# Patient Record
Sex: Male | Born: 1940
Health system: Southern US, Community
[De-identification: ages and names within clinical notes are randomized; demographics above are authoritative.]

## PROBLEM LIST (undated history)

## (undated) DIAGNOSIS — I350 Nonrheumatic aortic (valve) stenosis: Secondary | ICD-10-CM

## (undated) DIAGNOSIS — R079 Chest pain, unspecified: Secondary | ICD-10-CM

## (undated) DIAGNOSIS — K219 Gastro-esophageal reflux disease without esophagitis: Secondary | ICD-10-CM

## (undated) DIAGNOSIS — E669 Obesity, unspecified: Secondary | ICD-10-CM

## (undated) DIAGNOSIS — I10 Essential (primary) hypertension: Secondary | ICD-10-CM

## (undated) DIAGNOSIS — R7309 Other abnormal glucose: Secondary | ICD-10-CM

## (undated) DIAGNOSIS — E785 Hyperlipidemia, unspecified: Secondary | ICD-10-CM

## (undated) DIAGNOSIS — R0609 Other forms of dyspnea: Secondary | ICD-10-CM

## (undated) DIAGNOSIS — M5416 Radiculopathy, lumbar region: Secondary | ICD-10-CM

## (undated) DIAGNOSIS — I517 Cardiomegaly: Secondary | ICD-10-CM

## (undated) HISTORY — DX: Other forms of dyspnea: R06.09

## (undated) HISTORY — DX: Hyperlipidemia, unspecified: E78.5

## (undated) HISTORY — DX: Nonrheumatic aortic (valve) stenosis: I35.0

## (undated) HISTORY — DX: Other abnormal glucose: R73.09

## (undated) HISTORY — DX: Obesity, unspecified: E66.9

## (undated) HISTORY — DX: Radiculopathy, lumbar region: M54.16

## (undated) HISTORY — DX: Essential (primary) hypertension: I10

## (undated) HISTORY — DX: Cardiomegaly: I51.7

## (undated) HISTORY — DX: Chest pain, unspecified: R07.9

## (undated) HISTORY — DX: Gastro-esophageal reflux disease without esophagitis: K21.9

---

## 2015-10-01 ENCOUNTER — Observation Stay (HOSPITAL_COMMUNITY)
Admission: EM | Admit: 2015-10-01 | Discharge: 2015-10-02 | Disposition: A | Discharge status: Home / Self Care | Payer: Medicare Other | Attending: Internal Medicine | Admitting: Internal Medicine

## 2015-10-01 ENCOUNTER — Encounter (HOSPITAL_COMMUNITY): Payer: Self-pay | Admitting: Emergency Medicine

## 2015-10-01 ENCOUNTER — Emergency Department (HOSPITAL_COMMUNITY): Payer: Medicare Other

## 2015-10-01 DIAGNOSIS — K219 Gastro-esophageal reflux disease without esophagitis: Secondary | ICD-10-CM | POA: Diagnosis not present

## 2015-10-01 DIAGNOSIS — R011 Cardiac murmur, unspecified: Secondary | ICD-10-CM | POA: Diagnosis not present

## 2015-10-01 DIAGNOSIS — I119 Hypertensive heart disease without heart failure: Principal | ICD-10-CM | POA: Insufficient documentation

## 2015-10-01 DIAGNOSIS — Z8249 Family history of ischemic heart disease and other diseases of the circulatory system: Secondary | ICD-10-CM | POA: Insufficient documentation

## 2015-10-01 DIAGNOSIS — R079 Chest pain, unspecified: Secondary | ICD-10-CM | POA: Diagnosis present

## 2015-10-01 DIAGNOSIS — E669 Obesity, unspecified: Secondary | ICD-10-CM

## 2015-10-01 DIAGNOSIS — Z803 Family history of malignant neoplasm of breast: Secondary | ICD-10-CM | POA: Diagnosis not present

## 2015-10-01 DIAGNOSIS — I2510 Atherosclerotic heart disease of native coronary artery without angina pectoris: Secondary | ICD-10-CM | POA: Diagnosis not present

## 2015-10-01 DIAGNOSIS — Z6831 Body mass index (BMI) 31.0-31.9, adult: Secondary | ICD-10-CM | POA: Diagnosis not present

## 2015-10-01 DIAGNOSIS — E785 Hyperlipidemia, unspecified: Secondary | ICD-10-CM

## 2015-10-01 DIAGNOSIS — F419 Anxiety disorder, unspecified: Secondary | ICD-10-CM | POA: Diagnosis not present

## 2015-10-01 DIAGNOSIS — R0600 Dyspnea, unspecified: Secondary | ICD-10-CM

## 2015-10-01 DIAGNOSIS — I10 Essential (primary) hypertension: Secondary | ICD-10-CM | POA: Diagnosis present

## 2015-10-01 DIAGNOSIS — Z7982 Long term (current) use of aspirin: Secondary | ICD-10-CM | POA: Diagnosis not present

## 2015-10-01 DIAGNOSIS — R0609 Other forms of dyspnea: Secondary | ICD-10-CM

## 2015-10-01 DIAGNOSIS — Z833 Family history of diabetes mellitus: Secondary | ICD-10-CM | POA: Insufficient documentation

## 2015-10-01 DIAGNOSIS — I517 Cardiomegaly: Secondary | ICD-10-CM | POA: Diagnosis present

## 2015-10-01 DIAGNOSIS — I350 Nonrheumatic aortic (valve) stenosis: Secondary | ICD-10-CM

## 2015-10-01 HISTORY — DX: Dyspnea, unspecified: R06.00

## 2015-10-01 HISTORY — DX: Other forms of dyspnea: R06.09

## 2015-10-01 HISTORY — DX: Chest pain, unspecified: R07.9

## 2015-10-01 HISTORY — DX: Gastro-esophageal reflux disease without esophagitis: K21.9

## 2015-10-01 HISTORY — DX: Cardiomegaly: I51.7

## 2015-10-01 HISTORY — DX: Obesity, unspecified: E66.9

## 2015-10-01 HISTORY — DX: Essential (primary) hypertension: I10

## 2015-10-01 LAB — BASIC METABOLIC PANEL
Anion gap: 9 (ref 5–15)
BUN: 13 mg/dL (ref 6–20)
CALCIUM: 9.7 mg/dL (ref 8.9–10.3)
CO2: 23 mmol/L (ref 22–32)
CREATININE: 0.89 mg/dL (ref 0.61–1.24)
Chloride: 105 mmol/L (ref 101–111)
GFR calc non Af Amer: >60 mL/min (ref >60)
GLUCOSE: 104 mg/dL — AB (ref 65–99)
Potassium: 3.9 mmol/L (ref 3.5–5.1)
Sodium: 137 mmol/L (ref 135–145)

## 2015-10-01 LAB — CBC WITH DIFFERENTIAL/PLATELET
BASOS PCT: 0 %
Basophils Absolute: 0 10*3/uL (ref 0.0–0.1)
EOS PCT: 0 %
Eosinophils Absolute: 0 10*3/uL (ref 0.0–0.7)
HEMATOCRIT: 47.8 % (ref 39.0–52.0)
Hemoglobin: 16 g/dL (ref 13.0–17.0)
Lymphocytes Relative: 18 %
Lymphs Abs: 1.3 10*3/uL (ref 0.7–4.0)
MCH: 29.6 pg (ref 26.0–34.0)
MCHC: 33.5 g/dL (ref 30.0–36.0)
MCV: 88.5 fL (ref 78.0–100.0)
MONO ABS: 0.5 10*3/uL (ref 0.1–1.0)
MONOS PCT: 7 %
NEUTROS ABS: 5.4 10*3/uL (ref 1.7–7.7)
Neutrophils Relative %: 75 %
PLATELETS: 236 10*3/uL (ref 150–400)
RBC: 5.4 MIL/uL (ref 4.22–5.81)
RDW: 12.6 % (ref 11.5–15.5)
WBC: 7.2 10*3/uL (ref 4.0–10.5)

## 2015-10-01 LAB — I-STAT TROPONIN, ED: Troponin i, poc: 0.01 ng/mL (ref 0.00–0.08)

## 2015-10-01 LAB — TROPONIN I: Troponin I: <0.03 ng/mL (ref <0.03)

## 2015-10-01 LAB — LIPID PANEL
Cholesterol: 206 mg/dL — ABNORMAL HIGH (ref 0–200)
HDL: 35 mg/dL — ABNORMAL LOW (ref >40)
LDL CALC: 149 mg/dL — AB (ref 0–99)
Total CHOL/HDL Ratio: 5.9 RATIO
Triglycerides: 110 mg/dL (ref <150)
VLDL: 22 mg/dL (ref 0–40)

## 2015-10-01 MED ORDER — OMEPRAZOLE MAGNESIUM 20 MG PO TBEC: 20.0000 mg | DELAYED_RELEASE_TABLET | Freq: Every day | ORAL | Status: DC | PRN

## 2015-10-01 MED ORDER — ACETAMINOPHEN 325 MG PO TABS: 650.0000 mg | ORAL_TABLET | Freq: Four times a day (QID) | ORAL | Status: DC | PRN

## 2015-10-01 MED ORDER — ASPIRIN 81 MG PO CHEW
324.0000 mg | CHEWABLE_TABLET | Freq: Once | ORAL | Status: AC
  Administered 2015-10-01: 324 mg via ORAL
  Filled 2015-10-01: qty 4

## 2015-10-01 MED ORDER — ACETAMINOPHEN 650 MG RE SUPP: 650.0000 mg | Freq: Four times a day (QID) | RECTAL | Status: DC | PRN

## 2015-10-01 MED ORDER — PNEUMOCOCCAL VAC POLYVALENT 25 MCG/0.5ML IJ INJ: 0.5000 mL | INJECTION | INTRAMUSCULAR | Status: DC

## 2015-10-01 MED ORDER — ENOXAPARIN SODIUM 40 MG/0.4ML ~~LOC~~ SOLN
40.0000 mg | SUBCUTANEOUS | Status: DC
  Administered 2015-10-01: 40 mg via SUBCUTANEOUS
  Filled 2015-10-01: qty 0.4

## 2015-10-01 MED ORDER — INFLUENZA VAC SPLIT QUAD 0.5 ML IM SUSY: 0.5000 mL | PREFILLED_SYRINGE | INTRAMUSCULAR | Status: DC

## 2015-10-01 MED ORDER — HYDROCHLOROTHIAZIDE 12.5 MG PO CAPS
12.5000 mg | ORAL_CAPSULE | Freq: Every day | ORAL | Status: DC
  Administered 2015-10-01: 12.5 mg via ORAL
  Filled 2015-10-01: qty 1

## 2015-10-01 MED ORDER — ASPIRIN EC 81 MG PO TBEC
81.0000 mg | DELAYED_RELEASE_TABLET | Freq: Every day | ORAL | Status: DC
  Administered 2015-10-02: 81 mg via ORAL
  Filled 2015-10-01: qty 1

## 2015-10-01 MED ORDER — SODIUM CHLORIDE 0.9% FLUSH: 3.0000 mL | Freq: Two times a day (BID) | INTRAVENOUS | Status: DC

## 2015-10-01 NOTE — H&P (Signed)
Date: 10/01/2015               Patient Name:  Gabriel Sosa MRN: [id number]  DOB: May 05, 1940 Age / Sex: 75 y.o., male   PCP: No primary care provider on file.         Medical Service: Internal Medicine Teaching Service         Attending Physician: Dr. Judyann Munson, MD    First Contact: Dr. Peggyann Juba Pager: [phone]  Second Contact: Dr. Lawerance Bach Pager: [phone]       After Hours (After 5p/  First Contact Pager: [phone]  weekends / holidays): Second Contact Pager: [phone]   Chief Complaint: Chest heaviness  History of Present Illness: Gabriel Sosa is a 75 yo male with PMHx of HTN and obesity who presented to the ED with high blood pressure and anxiety .   Patient specifically came to the ED today because of being scared after seeing his blood pressure 170/110. He was asymptomatic at this time, but after seeing this number he became "scared to death" and then became short of breath. Patient admits to intermittent shortness of breath over the last 3 months which occurs with exertion, but does not occur always with exertion. He does not have associated lightheadedness, nausea, vomiting, palpitations, numbness or palpitations. Separately, he has had a few episodes of chest heaviness that were not associated with shortness of breath or exertion.  He has been more sedentary for last 4 years.  Patient states he plays golf, but normally rides in a cart and also uses a riding lawn mower, but admits that it is strenuous to get it out. The last episode of shortness of breath occurred one week ago. Patient admits to having increased stress and anxiety recently as he has been rushing to finish a seminary class. He noticed increased blood pressure at home and had been taking his wife's lisinopril for the last 3 days.   Patient admits to occasional tingling and pruritus when he showers, mostly in his left hand. He admits to a 30 pounds weight loss after cutting out carbs since July. Patient has  a history of heartburn and will occasional take a Zantac with relief. This has improved after losing weight. He denies orthopnea, lower extremity swelling.    In the ED, he was chest pain free, hypertensive to up to 180s/90s, and saturating well on room air.  He received 324 mg aspirin.   Meds:  Current Meds  Medication Sig  . amoxicillin (AMOXIL) 500 MG capsule Take 500 mg by mouth 3 (three) times daily. 7 days course filled from 09/23/15  . lisinopril (PRINIVIL,ZESTRIL) 20 MG tablet Take 20 mg by mouth daily as needed (High blood pressure).     Allergies: Allergies as of 10/01/2015  . (No Known Allergies)   Family History:  Mother: CAD, T2DM, Breast Cancer, Dementia  Social History: Denies Tobacco Use, former smoker 3 pack year history 50 years ago. Denies alcohol or illicit drug use.  Surgical History: Sebaceous Cyst Removal  Medicines: Metamucil QD, Zantac QD prn  Review of Systems: A complete ROS was negative except as per HPI.  Physical Exam: Blood pressure 147/87, pulse 76, temperature 98.4 F (36.9 C), temperature source Oral, resp. rate 12, SpO2 97 %.  Physical Exam  Constitutional: He is oriented to person, place, and time. He appears well-developed and well-nourished. No distress.  HENT:  Head: Normocephalic and atraumatic.  Eyes: Conjunctivae are normal. No scleral icterus.  Neck: Normal range  of motion. Neck supple.  Cardiovascular:  Normal rate, regular rhythm, normal S1S2, no gallop, 3/6 holosystolic murmur loudest over RUSB  Pulmonary/Chest: Effort normal and breath sounds normal. No respiratory distress.  Abdominal: Soft. He exhibits no distension. There is no tenderness.  Musculoskeletal: He exhibits no edema or tenderness.  Neurological: He is alert and oriented to person, place, and time.  Skin: Skin is dry.  Psychiatric:  Mildly anxious mood and affect, talkative     CBC Latest Ref Rng & Units 10/01/2015  WBC 4.0 - 10.5 K/uL 7.2  Hemoglobin 13.0  - 17.0 g/dL 47.816.0  Hematocrit 29.539.0 - 52.0 % 47.8  Platelets 150 - 400 K/uL 236   BMP Latest Ref Rng & Units 10/01/2015  Glucose 65 - 99 mg/dL 621(H104(H)  BUN 6 - 20 mg/dL 13  Creatinine 0.860.61 - 5.781.24 mg/dL 4.690.89  Sodium 629135 - 528145 mmol/L 137  Potassium 3.5 - 5.1 mmol/L 3.9  Chloride 101 - 111 mmol/L 105  CO2 22 - 32 mmol/L 23  Calcium 8.9 - 10.3 mg/dL 9.7   Troponin (Point of Care Test)  Recent Labs  10/01/15 1010  TROPIPOC 0.01    EKG: NSR, no ST changes, TWI, or Q waves  CXR:  "IMPRESSION: 1.  Low lung volumes with mild bibasilar atelectasis.  2. Cardiomegaly.  No pulmonary venous congestion ."  Assessment & Plan by Problem: Principal Problem:   Chest pain Active Problems:   HTN (hypertension)   Cardiomegaly   Obesity  Gabriel Sosa is a 75 yo male with PMHx of HTN and obesity who presented to the ED with high blood pressure and anxiety regarding his heart health.  His dyspnea is exertional and relieved by rest, but is not associated with chest pain and is not reliably associated with exercise.  No cough or wheezing to suggest COPD.  Afebrile, with clear chest radiographs and no leukocytosis to suggest pneumonia.  No orthopnea, PND, or edema to suggest decompensated heart failure, but has cardiomegaly on CXR and untreated HTN so is at risk.  Low risk for PE (Wells 0).  Stable angina and anxiety are the most likely considerations.  His HEART Score is low to moderate (3-4) with known risk factors of age, obesity, and HTN.  #Dyspnea -Telemetry -Trend troponins -Repeat EKG in morning -Lipid panel -A1c -Evaluate indications for statin therapy pending risk factor characterization  #Systolic Murmur Holosystolic murmur suspicious for aortic stenosis.  No prior evaluation. -Echo  #HTN Hypertensive here, with reported numerous home elevated BPs over the past months.  Likely represents essential hypertension. -Start HCTZ 12.5, increase to 25 tomorrow  DVT/PE ppx: Lovenox SQ  QD FEN: HH CODE: FULL  Dispo: Admit patient to Observation with expected length of stay less than 2 midnights.  Signed: Alm BustardMatthew O'Sullivan, MD 10/01/2015, 12:04 PM  Pager: [phone]

## 2015-10-01 NOTE — ED Triage Notes (Signed)
Pt here with htn, CP, left arm pain and dizziness x several days

## 2015-10-01 NOTE — ED Provider Notes (Signed)
[facility]-EMERGENCY DEPT Provider Note   CSN: [id number] Arrival date & time: 10/01/15  4098     History   Chief Complaint Chief Complaint  Patient presents with  . Chest Pain    HPI Karmine Kauer Savarese is a 75 y.o. male.  The history is provided by the patient.  Chest Pain   Pertinent negatives include no abdominal pain, no cough, no fever, no nausea, no shortness of breath and no vomiting.  75 year old male who presents with intermittent chest pain. He has no known past medical history, but states he has not been to a primary care doctor doctor in several years. Has been told he has had elevated blood pressure by his dentist recently. States that over the course of the past several months he has had intermittent chest pain. Pain described as chest tightness and achiness in the center of his chest that is nonradiating. It is associated with tingling and achiness in the left arm and mild shortness of breath and occasional lightheadedness. He primarily notices these symptoms when he is doing activities such as walking across the street or across the parking lot. The pain lasts for few minutes and would go away with rest. He has history of tobacco use and his teenage years and early 90s. No strong family history of CAD. Encouraged by wife to come to ED for evaluation. No chest pain currently.  History reviewed. No pertinent past medical history.  Patient Active Problem List   Diagnosis Date Noted  . Chest pain 10/01/2015    History reviewed. No pertinent surgical history.     Home Medications    Prior to Admission medications   Medication Sig Start Date End Date Taking? Authorizing Provider  amoxicillin (AMOXIL) 500 MG capsule Take 500 mg by mouth 3 (three) times daily. 7 days course filled from 09/23/15 09/23/15  Yes Historical Provider, MD  lisinopril (PRINIVIL,ZESTRIL) 20 MG tablet Take 20 mg by mouth daily as needed (High blood pressure).    Yes Historical Provider, MD    omeprazole (PRILOSEC OTC) 20 MG tablet Take 20 mg by mouth daily as needed (Heart burn).   Yes Historical Provider, MD    Family History History reviewed. No pertinent family history.  Social History Social History  Substance Use Topics  . Smoking status: Never Smoker  . Smokeless tobacco: Never Used  . Alcohol use No     Allergies   Review of patient's allergies indicates no known allergies.   Review of Systems Review of Systems  Constitutional: Negative for fever.  Respiratory: Negative for cough and shortness of breath.   Cardiovascular: Negative for leg swelling.  Gastrointestinal: Negative for abdominal pain, nausea and vomiting.  All other systems reviewed and are negative.    Physical Exam Updated Vital Signs BP 175/89   Pulse 76   Temp 98.4 F (36.9 C) (Oral)   Resp 13   SpO2 97%   Physical Exam Physical Exam  Nursing note and vitals reviewed. Constitutional: Well developed, well nourished, non-toxic, and in no acute distress Head: Normocephalic and atraumatic.  Mouth/Throat: Oropharynx is clear and moist.  Neck: Normal range of motion. Neck supple.  Cardiovascular: Normal rate and regular rhythm.  No LE edema Pulmonary/Chest: Effort normal and breath sounds normal.  Abdominal: Soft. There is no tenderness. There is no rebound and no guarding.  Musculoskeletal: Normal range of motion.  Neurological: Alert, no facial droop, fluent speech, moves all extremities symmetrically Skin: Skin is warm and dry.  Psychiatric: Cooperative  ED Treatments / Results  Labs (all labs ordered are listed, but only abnormal results are displayed) Labs Reviewed  BASIC METABOLIC PANEL - Abnormal; Notable for the following:       Result Value   Glucose, Bld 104 (*)    All other components within normal limits  CBC WITH DIFFERENTIAL/PLATELET  I-STAT TROPOININ, ED    EKG  EKG Interpretation  Date/Time:  Friday October 01 2015 [id number] EDT Ventricular Rate:   88 PR Interval:  190 QRS Duration: 76 QT Interval:  366 QTC Calculation: 442 R Axis:   82 Text Interpretation:  Normal sinus rhythm Normal ECG no prior EKG  Confirmed by Efren Kross MD, Velma Agnes [phone]([id number]) on 10/01/2015 10:38:58 AM       Radiology Dg Chest 2 View  Result Date: 10/01/2015 CLINICAL DATA:  Hypertension. EXAM: CHEST  2 VIEW COMPARISON:  No recent prior . FINDINGS: Mediastinum and hilar structures normal. Cardiomegaly with normal pulmonary vascularity. Low lung volumes with mild bibasilar atelectasis and/or infiltrate. IMPRESSION: 1.  Low lung volumes with mild bibasilar atelectasis. 2. Cardiomegaly.  No pulmonary venous congestion . Electronically Signed   By: Maisie Fushomas  Register   On: 10/01/2015 10:55    Procedures Procedures (including critical care time)  Medications Ordered in ED Medications  aspirin chewable tablet 324 mg (324 mg Oral Given 10/01/15 1001)     Initial Impression / Assessment and Plan / ED Course  I have reviewed the triage vital signs and the nursing notes.  Pertinent labs & imaging results that were available during my care of the patient were reviewed by me and considered in my medical decision making (see chart for details).  Clinical Course    75 year old male who presents with intermittent chest pain with activity. Chest pain-free in the ED. Is hypertensive, but remainder of his vital signs are non-concerning. He is well-appearing in no acute distress. EKG without acute ischemic changes. Troponin 1 is negative. Unclear of all of his risk factors as he has no primary care doctor for several years. However, with Heart score of 4, given age, obesity, moderately suspicious history. Will admit for remainder of his cardiac rule out, potential stress testing. Admitted to internal medicine teaching service.  Final Clinical Impressions(s) / ED Diagnoses   Final diagnoses:  Nonspecific chest pain    New Prescriptions New Prescriptions   No medications on file      Lavera Guiseana Duo Durinda Buzzelli, MD 10/01/15 1134

## 2015-10-01 NOTE — Discharge Summary (Signed)
Name: Gabriel Sosa MRN: [id number] DOB: Nov 11, 1940 75 y.o. PCP: No primary care provider on file.  Date of Admission: 10/01/2015  9:25 AM Date of Discharge: 10/02/2015 Attending Physician: Judyann Munson  Discharge Diagnosis: 1. Hypertension 2. Hyperlipidemia   Discharge Medications:   Medication List    STOP taking these medications   amoxicillin 500 MG capsule Commonly known as:  AMOXIL   lisinopril 20 MG tablet Commonly known as:  PRINIVIL,ZESTRIL     TAKE these medications   aspirin 81 MG EC tablet Take 1 tablet (81 mg total) by mouth daily. Start taking on:  10/03/2015   atorvastatin 40 MG tablet Commonly known as:  LIPITOR Take 1 tablet (40 mg total) by mouth daily at 6 PM.   hydrochlorothiazide 25 MG tablet Commonly known as:  HYDRODIURIL Take 1 tablet (25 mg total) by mouth daily. Start taking on:  10/03/2015   omeprazole 20 MG tablet Commonly known as:  PRILOSEC OTC Take 20 mg by mouth daily as needed (Heart burn).       Disposition and follow-up:   Gabriel Sosa was discharged from [facility] in Good condition.  At the hospital follow up visit please address:  1.  HTN.  Started on HCTZ 25mg  daily for essential hypertension.  Please reassess blood pressure and manage antihypertensives.  2.  HL.  Discharged with high intensity statin for primary prevention of CAD.   3.  Labs / imaging needed at time of follow-up: BMP  4.  Pending labs/ test needing follow-up: echocardiogram (ordered, to be scheduled)  Follow-up Appointments: Follow-up Information    [facility]. Schedule an appointment as soon as possible for a visit in 2 week(s).   Why:  They will call you to schedule an appointment within the next 2 weeks.  If you do not hear from them before the end of the week, please call them to schedule. Contact information: [address] [place] [address] [phone]          Hospital Course by problem list: Principal Problem:   HTN (hypertension) Active Problems:   Cardiomegaly   Obesity   DOE (dyspnea on exertion)   1. Hypertension He has noted home BPs up to 170s/100s over the past several months, and anxiety over a high home BP reading at home motivated him to present to the ED.  Consistent with his reported home measurements, he was hypertensive with highest BPs 180s/90s, likely representing essential hypertension.  He was started on HCTZ 25mg  daily and instructed to follow up at the [facility] to establish a PCP  2.  Dyspnea Gabriel Sosa has had occasional, intermittent dyspnea on exertion for the past 3 months that has not been accompanied  by chest discomfort, diaphoresis, or nausea. Separately, he has had rare episodes of chest heaviness lasting less than 1 minute.  He is anxious about his cardiac health.   He has not had regular medical care for many years.  He has several risk factors for CAD, including age, sex, obesity, HTN, HL and sedentary lifestyle.  ACS was ruled out with normal EKGs without signs of ischemia and negative troponins.  His risk factors were further investigated with lipid panel which showed hyperlipidemia.  HgbA1c normal at 5.1%.  His ASCVD is 39.7%.  He was discharged on 81 mg aspirin and high intensity statin for primary prevention of cardiovascular disease.  3.  Systolic Murmur He was found to have a  3/6 holosystolic murmur in the aortic position. Outpatient echocardiogram ordered, echo lab should call him on Monday to schedule.  Discharge Vitals:   BP (!) 161/80 (BP Location: Right Arm) Comment: MD notified  Pulse 79   Temp 98.1 F (36.7 C) (Oral)   Resp 18   Ht 6\' 1"  (1.854 m)   Wt 238 lb (108 kg)   SpO2 100%   BMI 31.40 kg/m   Pertinent Labs, Studies, and Procedures:   EKG: normal sinus rhythm, no Q waves, TWI, ST changes   Cardiac Panel (last 3 results)  Recent Labs  10/01/15 1707 10/01/15 2220    TROPONINI <0.03 <0.03   Lipid Panel     Component Value Date/Time   CHOL 206 (H) 10/01/2015 1707   TRIG 110 10/01/2015 1707   HDL 35 (L) 10/01/2015 1707   CHOLHDL 5.9 10/01/2015 1707   VLDL 22 10/01/2015 1707   LDLCALC 149 (H) 10/01/2015 1707   Lab Results  Component Value Date   HGBA1C 5.1 10/01/2015    Discharge Instructions: Discharge Instructions    Diet - low sodium heart healthy    Complete by:  As directed    Increase activity slowly    Complete by:  As directed      You were admitted to the hospital out of concern for your heart.  We monitored you over night and made sure that you were not having a heart attack and that nothing else dangerous is going on at the moment.  We did find that you have high blood pressure and high cholesterol, both of which increase your chances of having a heart attack in the long run.  You also have a heart murmur.  This is not an emergency, but we would like to schedule you for an echocardiogram (ultrasound of the heart) to see what is causing it.  They should call you to schedule the echocardiogram this week.  I have given you prescriptions for HCTZ for blood pressure, atorvastatin for cholesterol, and aspirin to lower risk of heart attack.  Please take these medicines every day as prescribed.  It is important to follow regularly with a primary care doctor.  We will [facility].  The secretary should call you this week to schedule an appointment.  If you have chest pain or shortness of breath, please call [phone] or come to the ED as these may be signs of a heart attack.  Signed: Alm BustardMatthew O'Sullivan, MD 10/02/2015, 9:43 PM   Pager: [phone]

## 2015-10-01 NOTE — Progress Notes (Signed)
Pt questioned about DNR status.  He states if he coded he would not want to be on life support, but would like us to attempt basic resuscitation.  MD paged to notify clarification in code status needed.

## 2015-10-02 ENCOUNTER — Other Ambulatory Visit (HOSPITAL_COMMUNITY): Payer: Medicare Other

## 2015-10-02 DIAGNOSIS — R011 Cardiac murmur, unspecified: Secondary | ICD-10-CM

## 2015-10-02 DIAGNOSIS — I10 Essential (primary) hypertension: Secondary | ICD-10-CM | POA: Diagnosis not present

## 2015-10-02 DIAGNOSIS — R0609 Other forms of dyspnea: Secondary | ICD-10-CM

## 2015-10-02 DIAGNOSIS — K219 Gastro-esophageal reflux disease without esophagitis: Secondary | ICD-10-CM | POA: Diagnosis not present

## 2015-10-02 DIAGNOSIS — E785 Hyperlipidemia, unspecified: Secondary | ICD-10-CM | POA: Diagnosis not present

## 2015-10-02 DIAGNOSIS — F419 Anxiety disorder, unspecified: Secondary | ICD-10-CM | POA: Diagnosis not present

## 2015-10-02 DIAGNOSIS — I119 Hypertensive heart disease without heart failure: Secondary | ICD-10-CM | POA: Diagnosis not present

## 2015-10-02 LAB — BASIC METABOLIC PANEL
Anion gap: 6 (ref 5–15)
BUN: 12 mg/dL (ref 6–20)
CALCIUM: 9.4 mg/dL (ref 8.9–10.3)
CO2: 27 mmol/L (ref 22–32)
CREATININE: 0.89 mg/dL (ref 0.61–1.24)
Chloride: 103 mmol/L (ref 101–111)
GFR calc Af Amer: >60 mL/min (ref >60)
Glucose, Bld: 95 mg/dL (ref 65–99)
Potassium: 3.8 mmol/L (ref 3.5–5.1)
SODIUM: 136 mmol/L (ref 135–145)

## 2015-10-02 LAB — CBC
HCT: 46.5 % (ref 39.0–52.0)
Hemoglobin: 15.4 g/dL (ref 13.0–17.0)
MCH: 29.7 pg (ref 26.0–34.0)
MCHC: 33.1 g/dL (ref 30.0–36.0)
MCV: 89.6 fL (ref 78.0–100.0)
PLATELETS: 222 10*3/uL (ref 150–400)
RBC: 5.19 MIL/uL (ref 4.22–5.81)
RDW: 12.6 % (ref 11.5–15.5)
WBC: 8.1 10*3/uL (ref 4.0–10.5)

## 2015-10-02 LAB — TROPONIN I

## 2015-10-02 LAB — HEMOGLOBIN A1C
Hgb A1c MFr Bld: 5.1 % (ref 4.8–5.6)
Mean Plasma Glucose: 100 mg/dL

## 2015-10-02 MED ORDER — ATORVASTATIN CALCIUM 40 MG PO TABS: 40.0000 mg | ORAL_TABLET | Freq: Every day | ORAL | Status: DC

## 2015-10-02 MED ORDER — HYDROCHLOROTHIAZIDE 25 MG PO TABS: 25.0000 mg | ORAL_TABLET | Freq: Every day | ORAL | 3 refills | Status: DC

## 2015-10-02 MED ORDER — HYDROCHLOROTHIAZIDE 25 MG PO TABS
25.0000 mg | ORAL_TABLET | Freq: Every day | ORAL | Status: DC
  Administered 2015-10-02: 25 mg via ORAL
  Filled 2015-10-02: qty 1

## 2015-10-02 MED ORDER — ASPIRIN 81 MG PO TBEC: 81.0000 mg | DELAYED_RELEASE_TABLET | Freq: Every day | ORAL | Status: DC

## 2015-10-02 MED ORDER — ATORVASTATIN CALCIUM 40 MG PO TABS: 40.0000 mg | ORAL_TABLET | Freq: Every day | ORAL | 2 refills | Status: DC

## 2015-10-02 NOTE — Progress Notes (Signed)
   Subjective: Feels well, with no chest pain or dyspnea.  Discussed importance of establishing a primary care provider for health maintenance.  He agreed with the plan to start pharmacologic therapy for hypertension and hyperlipidemia.   Objective:  Vital signs in last 24 hours: Vitals:   10/01/15 1928 10/01/15 2020 10/01/15 2314 10/02/15 0332  BP: (!) 173/102 (!) 164/95 (!) 151/88 (!) 161/80  Pulse: 76 69 75 79  Resp: 18  18 18   Temp: 98.4 F (36.9 C)  98.2 F (36.8 C) 98.1 F (36.7 C)  TempSrc: Oral  Oral Oral  SpO2: 99% 100% 98% 100%  Weight:      Height:       Physical Exam  Constitutional: He is oriented to person, place, and time. He appears well-developed and well-nourished. No distress.  Cardiovascular:  Regular rate and rhythm, 3/6 holosystolic murmur loudest at RUSB  Pulmonary/Chest: Effort normal and breath sounds normal.  Abdominal: Soft.  Neurological: He is alert and oriented to person, place, and time.  Skin: Skin is warm and dry.  Psychiatric: He has a normal mood and affect. His behavior is normal.   Cardiac Panel (last 3 results)  Recent Labs  10/01/15 1707 10/01/15 2220  TROPONINI <0.03 <0.03   Lipid Panel     Component Value Date/Time   CHOL 206 (H) 10/01/2015 1707   TRIG 110 10/01/2015 1707   HDL 35 (L) 10/01/2015 1707   CHOLHDL 5.9 10/01/2015 1707   VLDL 22 10/01/2015 1707   LDLCALC 149 (H) 10/01/2015 1707   HgbA1c 5.1%  Assessment/Plan:  Principal Problem:   HTN (hypertension) Active Problems:   Cardiomegaly   Obesity   DOE (dyspnea on exertion)  #HTN Hypertensive here, with reported numerous home elevated BPs over the past months.  Likely represents true essential hypertension.  Started on HCTZ 12.5 mg, still hypertensive.  May well require multiple agents. -Increase HCTZ to 25 mg daily -[facility] follow-up  #CAD Risk Troponins negative, no ischemic changes on EKG, no ACS.  He is at elevated risk for ACS (39.7% 10 year risk), with  risk factors of age, sex, obesity, sedentary lifestyle, HTN, and HL. -High intensity statin  [facility]  #Systolic Murmur Holosystolic murmur suspicious for aortic stenosis.  No prior evaluation. -Echo before discharge  #Dyspnea No recent episodes, likely related to anxiety.  Dispo: Anticipated discharge today.  Alm BustardMatthew O'Sullivan, MD 10/02/2015, 11:06 AM Pager: [phone]

## 2015-10-02 NOTE — Discharge Instructions (Signed)
You were admitted to the hospital out of concern for your heart.  We monitored you over night and made sure that you were not having a heart attack and that nothing else dangerous is going on at the moment.  We did find that you have high blood pressure and high cholesterol, both of which increase your chances of having a heart attack in the long run.  You also have a heart murmur.  This is not an emergency, but we would like to schedule you for an echocardiogram (ultrasound of the heart) to see what is causing it.  They should call you to schedule the echocardiogram this week.  I have given you prescriptions for HCTZ for blood pressure, atorvastatin for cholesterol, and aspirin to lower risk of heart attack.  Please take these medicines every day as prescribed.  It is important to follow regularly with a primary care doctor.  We will plan to see you at the [facility] here at [facility]  The secretary should call you this week to schedule an appointment.  If you have chest pain or shortness of breath, please call 911 or come to the ED as these may be signs of a heart attack.   Chest Pain Observation It is often hard to give a specific diagnosis for the cause of chest pain. Among other possibilities your symptoms might be caused by inadequate oxygen delivery to your heart (angina). Angina that is not treated or evaluated can lead to a heart attack (myocardial infarction) or death. Blood tests, electrocardiograms, and X-rays may have been done to help determine a possible cause of your chest pain. After evaluation and observation, your health care provider has determined that it is unlikely your pain was caused by an unstable condition that requires hospitalization. However, a full evaluation of your pain may need to be completed, with additional diagnostic testing as directed. It is very important to keep your follow-up appointments. Not keeping your follow-up appointments could result in  permanent heart damage, disability, or death. If there is any problem keeping your follow-up appointments, you must call your health care provider. HOME CARE INSTRUCTIONS  Due to the slight chance that your pain could be angina, it is important to follow your health care provider's treatment plan and also maintain a healthy lifestyle:  Maintain or work toward achieving a healthy weight.  Stay physically active and exercise regularly.  Decrease your salt intake.  Eat a balanced, healthy diet. Talk to a dietitian to learn about heart-healthy foods.  Increase your fiber intake by including whole grains, vegetables, fruits, and nuts in your diet.  Avoid situations that cause stress, anger, or depression.  Take medicines as advised by your health care provider. Report any side effects to your health care provider. Do not stop medicines or adjust the dosages on your own.  Quit smoking. Do not use nicotine patches or gum until you check with your health care provider.  Keep your blood pressure, blood sugar, and cholesterol levels within normal limits.  Limit alcohol intake to no more than 1 drink per day for women who are not pregnant and 2 drinks per day for men.  Do not abuse drugs. SEEK IMMEDIATE MEDICAL CARE IF: You have severe chest pain or pressure which may include symptoms such as:  You feel pain or pressure in your arms, neck, jaw, or back.  You have severe back or abdominal pain, feel sick to your stomach (nauseous), or throw up (vomit).  You  are sweating profusely.  You are having a fast or irregular heartbeat.  You feel short of breath while at rest.  You notice increasing shortness of breath during rest, sleep, or with activity.  You have chest pain that does not get better after rest or after taking your usual medicine.  You wake from sleep with chest pain.  You are unable to sleep because you cannot breathe.  You develop a frequent cough or you are coughing up  blood.  You feel dizzy, faint, or experience extreme fatigue.  You develop severe weakness, dizziness, fainting, or chills. Any of these symptoms may represent a serious problem that is an emergency. Do not wait to see if the symptoms will go away. Call your local emergency services (911 in the [place] Do not drive yourself to the hospital. MAKE SURE YOU:  Understand these instructions.  Will watch your condition.  Will get help right away if you are not doing well or get worse.   This information is not intended to replace advice given to you by your health care provider. Make sure you discuss any questions you have with your health care provider.   Document Released: 01/28/2010 Document Revised: 12/31/2012 Document Reviewed: 06/27/2012 Elsevier Interactive Patient Education 2016 [employer].   Aspirin and Your Heart  Aspirin is a medicine that affects the way blood clots. Aspirin can be used to help reduce the risk of blood clots, heart attacks, and other heart-related problems.  SHOULD I TAKE ASPIRIN? Your health care provider will help you determine whether it is safe and beneficial for you to take aspirin daily. Taking aspirin daily may be beneficial if you:  Have had a heart attack or chest pain.  Have undergone open heart surgery such as coronary artery bypass surgery (CABG).  Have had coronary angioplasty.  Have experienced a stroke or transient ischemic attack (TIA).  Have peripheral vascular disease (PVD).  Have chronic heart rhythm problems such as atrial fibrillation. ARE THERE ANY RISKS OF TAKING ASPIRIN DAILY? Daily use of aspirin can increase your risk of side effects. Some of these include:  Bleeding. Bleeding problems can be minor or serious. An example of a minor problem is a cut that does not stop bleeding. An example of a more serious problem is stomach bleeding or bleeding into the brain. Your risk of bleeding is increased if you are also taking  non-steroidal anti-inflammatory medicine (NSAIDs).  Increased bruising.  Upset stomach.  An allergic reaction. People who have nasal polyps have an increased risk of developing an aspirin allergy. WHAT ARE SOME GUIDELINES I SHOULD FOLLOW WHEN TAKING ASPIRIN?   Take aspirin only as directed by your health care provider. Make sure you understand how much you should take and what form you should take. The two forms of aspirin are:  Non-enteric-coated. This type of aspirin does not have a coating and is absorbed quickly. Non-enteric-coated aspirin is usually recommended for people with chest pain. This type of aspirin also comes in a chewable form.  Enteric-coated. This type of aspirin has a special coating that releases the medicine very slowly. Enteric-coated aspirin causes less stomach upset than non-enteric-coated aspirin. This type of aspirin should not be chewed or crushed.  Drink alcohol in moderation. Drinking alcohol increases your risk of bleeding. WHEN SHOULD I SEEK MEDICAL CARE?   You have unusual bleeding or bruising.  You have stomach pain.  You have an allergic reaction. Symptoms of an allergic reaction include:  Hives.  Itchy skin.  Swelling of the lips, tongue, or face.  You have ringing in your ears. WHEN SHOULD I SEEK IMMEDIATE MEDICAL CARE?   Your bowel movements are bloody, dark red, or black in color.  You vomit or cough up blood.  You have blood in your urine.  You cough, wheeze, or feel short of breath. If you have any of the following symptoms, this is an emergency. Do not wait to see if the pain will go away. Get medical help at once. Call your local emergency services (911 in the [place] Do not drive yourself to the hospital.  You have severe chest pain, especially if the pain is crushing or pressure-like and spreads to the arms, back, neck, or jaw.  You have stroke-like symptoms, such as:   Loss of vision.   Difficulty talking.   Numbness  or weakness on one side of your body.   Numbness or weakness in your arm or leg.   Not thinking clearly or feeling confused.    This information is not intended to replace advice given to you by your health care provider. Make sure you discuss any questions you have with your health care provider.   Document Released: 12/09/2007 Document Revised: 01/16/2014 Document Reviewed: 04/02/2013 Elsevier Interactive Patient Education [employer]2016 Elsevier Inc.

## 2015-10-04 DIAGNOSIS — E785 Hyperlipidemia, unspecified: Secondary | ICD-10-CM

## 2015-10-04 DIAGNOSIS — I350 Nonrheumatic aortic (valve) stenosis: Secondary | ICD-10-CM

## 2015-10-04 DIAGNOSIS — K219 Gastro-esophageal reflux disease without esophagitis: Secondary | ICD-10-CM

## 2015-10-11 ENCOUNTER — Ambulatory Visit (HOSPITAL_COMMUNITY)
Admission: RE | Admit: 2015-10-11 | Discharge: 2015-10-11 | Disposition: A | Discharge status: Home / Self Care | Payer: Medicare Other | Source: Ambulatory Visit | Attending: Internal Medicine | Admitting: Internal Medicine

## 2015-10-11 DIAGNOSIS — I071 Rheumatic tricuspid insufficiency: Secondary | ICD-10-CM | POA: Diagnosis not present

## 2015-10-11 DIAGNOSIS — I352 Nonrheumatic aortic (valve) stenosis with insufficiency: Secondary | ICD-10-CM | POA: Insufficient documentation

## 2015-10-11 DIAGNOSIS — R011 Cardiac murmur, unspecified: Secondary | ICD-10-CM | POA: Diagnosis not present

## 2015-10-11 DIAGNOSIS — I517 Cardiomegaly: Secondary | ICD-10-CM | POA: Insufficient documentation

## 2015-10-11 NOTE — Progress Notes (Signed)
  Echocardiogram 2D Echocardiogram has been performed.  Gabriel Sosa 10/11/2015, 10:34 AM

## 2015-10-14 ENCOUNTER — Telehealth: Payer: Self-pay

## 2015-10-14 NOTE — Telephone Encounter (Signed)
APT. REMINDER CALL, LMTCB °

## 2015-10-15 ENCOUNTER — Ambulatory Visit (INDEPENDENT_AMBULATORY_CARE_PROVIDER_SITE_OTHER): Payer: Medicare Other | Admitting: Internal Medicine

## 2015-10-15 VITALS — BP 149/105 | HR 94 | Temp 98.0°F | Ht 73.0 in | Wt 235.9 lb

## 2015-10-15 DIAGNOSIS — I350 Nonrheumatic aortic (valve) stenosis: Secondary | ICD-10-CM

## 2015-10-15 DIAGNOSIS — Z23 Encounter for immunization: Secondary | ICD-10-CM | POA: Diagnosis not present

## 2015-10-15 DIAGNOSIS — Z09 Encounter for follow-up examination after completed treatment for conditions other than malignant neoplasm: Secondary | ICD-10-CM

## 2015-10-15 DIAGNOSIS — Z79899 Other long term (current) drug therapy: Secondary | ICD-10-CM

## 2015-10-15 DIAGNOSIS — R0609 Other forms of dyspnea: Secondary | ICD-10-CM | POA: Diagnosis not present

## 2015-10-15 DIAGNOSIS — I082 Rheumatic disorders of both aortic and tricuspid valves: Secondary | ICD-10-CM | POA: Diagnosis not present

## 2015-10-15 DIAGNOSIS — I10 Essential (primary) hypertension: Secondary | ICD-10-CM

## 2015-10-15 DIAGNOSIS — Z7982 Long term (current) use of aspirin: Secondary | ICD-10-CM

## 2015-10-15 DIAGNOSIS — Z0000 Encounter for general adult medical examination without abnormal findings: Secondary | ICD-10-CM | POA: Insufficient documentation

## 2015-10-15 DIAGNOSIS — Z87891 Personal history of nicotine dependence: Secondary | ICD-10-CM

## 2015-10-15 MED ORDER — HYDROCHLOROTHIAZIDE 25 MG PO TABS: 25.0000 mg | ORAL_TABLET | Freq: Every day | ORAL | 1 refills | Status: DC

## 2015-10-15 MED ORDER — LISINOPRIL 5 MG PO TABS: 5.0000 mg | ORAL_TABLET | Freq: Every day | ORAL | 1 refills | Status: DC

## 2015-10-15 NOTE — Patient Instructions (Signed)
[name] it was nice meeting you today.  -Continue taking Hydrochlorothiazide 25 mg daily   -Start taking Lisinopril 5 mg daily   -Return for a follow-up visit in 4 weeks

## 2015-10-16 NOTE — Assessment & Plan Note (Signed)
Influenza vaccine at this visit 

## 2015-10-16 NOTE — Assessment & Plan Note (Addendum)
A Noted to have a murmur on exam during recent hospitalization. Recent echo (10/11/15) showing possibly bicuspid, severely thickened and calcified aortic valve with mild-mod. aortic stenosis and mild-mod. aortic regurgitation. Showing trivial tricuspid regurgitation. LVEF 65-70% and grade 1 diastolic dysfunction.  Patient denies any episodes of syncope.  P -CTM -No surgical indication at this time as patient is asymptomatic.

## 2015-10-16 NOTE — Progress Notes (Signed)
   CC: Pt is here for a hospital follow-up of HTN, dyspnea, and heart murmur.   HPI:  [name] is a 75 y.o. M with a PMHx of conditions listed below presenting to the clinic for a hospital follow-up of HTN, dyspnea, and heart murmur. Please see problem based charting for the status of the patient's current and chronic medical conditions.   Past Medical History:  Diagnosis Date  . GERD (gastroesophageal reflux disease)     Review of Systems:  Pertinent positives mentioned in HPI. Remainder of all ROS negative.   Physical Exam:  Vitals:   10/15/15 1318  BP: (!) 149/105  Pulse: 94  Temp: 98 F (36.7 C)  TempSrc: Oral  SpO2: 99%  Weight: 235 lb 14.4 oz (107 kg)  Height: 6\' 1"  (1.854 m)   Physical Exam  Constitutional: He is oriented to person, place, and time. He appears well-developed and well-nourished. No distress.  HENT:  Head: Normocephalic and atraumatic.  Mouth/Throat: Oropharynx is clear and moist.  Eyes: EOM are normal.  Neck: Neck supple. No tracheal deviation present.  Cardiovascular: Normal rate, regular rhythm and intact distal pulses.   Grade 3/6 systolic ejection murmur best appreciated at the R upper sternal border.   Pulmonary/Chest: Effort normal and breath sounds normal. No respiratory distress.  Abdominal: Soft. Bowel sounds are normal. He exhibits no distension. There is no tenderness.  Musculoskeletal: Normal range of motion. He exhibits no edema.  Neurological: He is alert and oriented to person, place, and time.  Skin: Skin is warm and dry.    Assessment & Plan:   See Encounters Tab for problem based charting.  Patient discussed with Dr. Heide SparkNarendra

## 2015-10-16 NOTE — Assessment & Plan Note (Signed)
BP Readings from Last 3 Encounters:  10/15/15 (!) 149/105  10/02/15 (!) 161/80    Lab Results  Component Value Date   NA 136 10/02/2015   K 3.8 10/02/2015   CREATININE 0.89 10/02/2015    Assessment: Blood pressure control:  above goal (<150/90) Comments: Patient was diagnosed with HTN during recent hospitalization and started HCTZ 25 mg daily.   Plan: Medications:  Start Lisinopril 5 mg daily. Continue HCTZ as above.  Educational resources provided:   Educated patient about healthy eating (DASH diet) and exercise.  Other plans:  -RTC in 4 weeks  -Check BMET at next visit

## 2015-10-16 NOTE — Assessment & Plan Note (Signed)
A During his recent hospitalization, patient complained of a 3 month history of intermittent DOE. Also reported rare episodes of chest heaviness lasting less than 1 minute. ACS was ruled out (EKG w/o signs of ischemia and negative troponins). Patient was started on a statin (ASCVD 39.7%) and aspirin 81 mg daily. He has risk factors for CAD including age, gender, obesity, HTN, HLD, and sedentary lifestyle.  At present, patient denies having any DOE or chest discomfort.  P -Continue current mgmt

## 2015-10-18 NOTE — Progress Notes (Signed)
Internal Medicine Clinic Attending  Case discussed with Dr. Rathoreat the time of the visit. We reviewed the resident's history and exam and pertinent patient test results. I agree with the assessment, diagnosis, and plan of care documented in the resident's note.  

## 2015-11-05 ENCOUNTER — Telehealth: Payer: Self-pay

## 2015-11-05 NOTE — Telephone Encounter (Signed)
APT. REMINDER CALL, LMTCB °

## 2015-11-08 ENCOUNTER — Ambulatory Visit (INDEPENDENT_AMBULATORY_CARE_PROVIDER_SITE_OTHER): Payer: Medicare Other | Admitting: Internal Medicine

## 2015-11-08 VITALS — BP 157/70 | HR 73 | Temp 97.4°F | Ht 73.0 in | Wt 231.2 lb

## 2015-11-08 DIAGNOSIS — Z7982 Long term (current) use of aspirin: Secondary | ICD-10-CM | POA: Diagnosis not present

## 2015-11-08 DIAGNOSIS — I10 Essential (primary) hypertension: Secondary | ICD-10-CM | POA: Diagnosis not present

## 2015-11-08 DIAGNOSIS — E785 Hyperlipidemia, unspecified: Secondary | ICD-10-CM | POA: Diagnosis not present

## 2015-11-08 DIAGNOSIS — Z79899 Other long term (current) drug therapy: Secondary | ICD-10-CM

## 2015-11-08 DIAGNOSIS — Z87891 Personal history of nicotine dependence: Secondary | ICD-10-CM

## 2015-11-08 MED ORDER — LISINOPRIL 5 MG PO TABS: 10.0000 mg | ORAL_TABLET | Freq: Every day | ORAL | 1 refills | Status: DC

## 2015-11-08 NOTE — Progress Notes (Signed)
I saw and evaluated the patient.  I personally confirmed the key portions of Dr. O'Sullivan's history and exam and reviewed pertinent patient test results.  The assessment, diagnosis, and plan were formulated together and I agree with the documentation in the resident's note. 

## 2015-11-08 NOTE — Progress Notes (Signed)
   CC: high blood pressure  HPI:  [name] is a 75 y.o. man with history of HTN, AS, and GERD who presents for management of hypertension.  Please see A&P for status of the patient's chronic medical conditions.  He was admitted 9/22-9/23 for hypertension and anxiety regarding his high blood pressures.  ACS was ruled out with serial troponins and a non-ischemic EKG.  He was discharged on HCTZ 25 mg for hypertension, and atorvastatin 40 mg and aspirin 81 mg for primary prevention of CAD.  He was subsequently seen in clinic of 10/6, and started on Lisinopril 5 mg daily in addition to continuing HCTZ.  Denies SOB.  Moderate walking, ~30 min 3-4x per week, no CP/SOB.  Has painful lower R wisdom tooth and intends to follow-up with [facility].  Getting up to urinate 2x per night since starting HCTZ.  No cough, no myalgias.    Past Medical History:  Diagnosis Date  . GERD (gastroesophageal reflux disease)     Review of Systems:   Review of Systems  Constitutional: Positive for weight loss. Negative for chills and fever.  Respiratory: Negative for cough and shortness of breath.   Cardiovascular: Negative for chest pain, palpitations and leg swelling.  Musculoskeletal: Negative for myalgias.  Psychiatric/Behavioral: The patient is nervous/anxious.     Physical Exam:  Vitals:   11/08/15 0820  BP: (!) 157/70  Pulse: 73  Temp: 97.4 F (36.3 C)  TempSrc: Oral  SpO2: 100%  Weight: 231 lb 3.2 oz (104.9 kg)  Height: 6\' 1"  (1.854 m)   Body mass index is 30.5 kg/m.  Physical Exam  Constitutional: He is oriented to person, place, and time. He appears well-developed and well-nourished. No distress.  Cardiovascular:  Regular rate and rhythm 3/6 systolic murmur  Pulmonary/Chest: Effort normal and breath sounds normal. He has no wheezes. He has no rales.  Musculoskeletal: He exhibits no edema.  Neurological: He is alert and oriented to person, place, and time.  Skin: Skin is  warm and dry.  Psychiatric: He has a normal mood and affect. His behavior is normal.     Assessment & Plan:   See Encounters Tab for problem based charting.  Patient seen with Dr. Josem KaufmannKlima

## 2015-11-08 NOTE — Assessment & Plan Note (Signed)
Lipid Panel     Component Value Date/Time   CHOL 206 (H) 10/01/2015 1707   TRIG 110 10/01/2015 1707   HDL 35 (L) 10/01/2015 1707   CHOLHDL 5.9 10/01/2015 1707   VLDL 22 10/01/2015 1707   LDLCALC 149 (H) 10/01/2015 1707   ASCVD 40.6% 10-year risk, non contraindications to high intensity statin.  Denies myalgias.   -Continue atorvastatin 40 mg daily

## 2015-11-08 NOTE — Patient Instructions (Addendum)
You were seen today for your high blood pressure.  Your blood pressure is doing much better, but still isn't quite as low as we would like.  Less than 140/90 is the goal.  I have increased your Lisinopril prescription to 10 mg daily from 5 mg daily.  You can take 2 of the 5 mg pills you have at the same time until you run out.  Please make an appointment for 3-4 weeks for us to check on your blood pressure again and see if we need to make any other changes to your medications.

## 2015-11-08 NOTE — Assessment & Plan Note (Addendum)
BP Readings from Last 3 Encounters:  11/08/15 (!) 157/70  10/15/15 (!) 149/105  10/02/15 (!) 161/80   Lab Results  Component Value Date   CREATININE 0.89 10/02/2015   Lab Results  Component Value Date   K 3.8 10/02/2015   Current medications: HCTZ 25 mg daily, lisinopril 5 mg daily  Manual BP today 145/75 Taking meds regularly with no missed doses, and has taken his HCTZ and lisinopril today.  Tolerating well. Exercises by walking for 30 min 3-4 times per week.  Assessment BP goal: 140/90 BP control: above goal  Plan Medications: continue HCTZ 25 mg daily, increase lisinopril to 10 mg daily Other: -Continue exercise and weight loss -BMP today -f/u 2-3 weeks to continue titrating Lisinopril -Refill lisinopril once stable dose established

## 2015-11-09 ENCOUNTER — Encounter: Payer: Self-pay | Admitting: Internal Medicine

## 2015-11-09 LAB — BMP8+ANION GAP
Anion Gap: 18 mmol/L (ref 10.0–18.0)
BUN / CREAT RATIO: 18 (ref 10–24)
BUN: 17 mg/dL (ref 8–27)
CO2: 22 mmol/L (ref 18–29)
CREATININE: 0.97 mg/dL (ref 0.76–1.27)
Calcium: 9.3 mg/dL (ref 8.6–10.2)
Chloride: 98 mmol/L (ref 96–106)
GFR calc Af Amer: 89 mL/min/{1.73_m2} (ref >59)
GFR, EST NON AFRICAN AMERICAN: 77 mL/min/{1.73_m2} (ref >59)
Glucose: 111 mg/dL — ABNORMAL HIGH (ref 65–99)
Potassium: 4.1 mmol/L (ref 3.5–5.2)
SODIUM: 138 mmol/L (ref 134–144)

## 2015-12-13 ENCOUNTER — Other Ambulatory Visit: Payer: Self-pay | Admitting: *Deleted

## 2015-12-13 ENCOUNTER — Encounter: Payer: Medicare Other | Admitting: Internal Medicine

## 2015-12-13 NOTE — Telephone Encounter (Signed)
Refill request for lisinopril 10mg .  Pt last seen on 11/08/15 and was told to increase lisinopril from 5mg  daily to 10mg  daily.  Pt has been taking two 5mg  tabs and have now run out.  Will send request to pcp to send in new rx for the 10mg  tabs, please advise.Criss AlvineGoldston, Lilyanne Mcquown Cassady12/4/201711:08 AM

## 2015-12-14 MED ORDER — LISINOPRIL 10 MG PO TABS: 10.0000 mg | ORAL_TABLET | Freq: Every day | ORAL | 1 refills | Status: DC

## 2015-12-23 ENCOUNTER — Other Ambulatory Visit: Payer: Self-pay | Admitting: *Deleted

## 2015-12-25 MED ORDER — ATORVASTATIN CALCIUM 40 MG PO TABS: 40.0000 mg | ORAL_TABLET | Freq: Every day | ORAL | 2 refills | Status: DC

## 2016-01-20 ENCOUNTER — Other Ambulatory Visit: Payer: Self-pay | Admitting: *Deleted

## 2016-01-20 MED ORDER — HYDROCHLOROTHIAZIDE 25 MG PO TABS: 25.0000 mg | ORAL_TABLET | Freq: Every day | ORAL | 1 refills | Status: DC

## 2016-01-20 MED ORDER — ATORVASTATIN CALCIUM 40 MG PO TABS: 40.0000 mg | ORAL_TABLET | Freq: Every day | ORAL | 2 refills | Status: DC

## 2016-02-14 ENCOUNTER — Encounter: Payer: Medicare Other | Admitting: Internal Medicine

## 2016-03-17 ENCOUNTER — Telehealth: Payer: Self-pay | Admitting: Internal Medicine

## 2016-03-17 NOTE — Telephone Encounter (Signed)
APT. REMINDER CALL, LMTCB °

## 2016-03-19 NOTE — Assessment & Plan Note (Deleted)
BP Readings from Last 3 Encounters:  11/08/15 (!) 157/70  10/15/15 (!) 149/105  10/02/15 (!) 161/80   Lab Results  Component Value Date   CREATININE 0.97 11/08/2015   Lab Results  Component Value Date   K 4.1 11/08/2015   Current medications: HCTZ 25 mg daily, lisinopril 10 mg daily  Increased lisinopril to 10 at last visit in 10/17.  Assessment BP goal: 140/90 BP control: above goal  Plan Medications: continue current meds Other: -Continue exercise and weight loss -BMP today

## 2016-03-19 NOTE — Assessment & Plan Note (Deleted)
Lipid Panel     Component Value Date/Time   CHOL 206 (H) 10/01/2015 1707   TRIG 110 10/01/2015 1707   HDL 35 (L) 10/01/2015 1707   CHOLHDL 5.9 10/01/2015 1707   VLDL 22 10/01/2015 1707   LDLCALC 149 (H) 10/01/2015 1707   A/P ASCVD 40.6% 10-year risk   -Continue atorvastatin 40 mg daily

## 2016-03-19 NOTE — Assessment & Plan Note (Deleted)
Colonoscopy Pneumovax Tetanus

## 2016-03-19 NOTE — Progress Notes (Deleted)
   CC: ***  HPI:  Mr.Eliyah Kennedy BuckerDallas Cregger is a 76 y.o. man with history of HTN, AS, and GERD who presents for management of hypertension.  Please see A&P for status of the patient's chronic medical conditions.   Past Medical History:  Diagnosis Date  . GERD (gastroesophageal reflux disease)     Review of Systems:   ROS  Physical Exam:  There were no vitals filed for this visit. There is no height or weight on file to calculate BMI.  Physical Exam   Assessment & Plan:   See Encounters Tab for problem based charting.  Patient *** Dr. Marland Kitchen***

## 2016-03-20 ENCOUNTER — Encounter: Payer: Medicare Other | Admitting: Internal Medicine

## 2016-03-21 ENCOUNTER — Other Ambulatory Visit: Payer: Self-pay | Admitting: Internal Medicine

## 2016-04-20 NOTE — Assessment & Plan Note (Deleted)
BP Readings from Last 3 Encounters:  11/08/15 (!) 157/70  10/15/15 (!) 149/105  10/02/15 (!) 161/80   Lab Results  Component Value Date   CREATININE 0.97 11/08/2015   Lab Results  Component Value Date   K 4.1 11/08/2015   Current medications: HCTZ 25 mg daily, lisinopril 10 mg daily  Assessment BP goal: 140/90 BP control: above goal  Plan Medications: continue current meds Other: -Continue exercise and weight loss -BMP today

## 2016-04-20 NOTE — Assessment & Plan Note (Deleted)
Lipid Panel     Component Value Date/Time   CHOL 206 (H) 10/01/2015 1707   TRIG 110 10/01/2015 1707   HDL 35 (L) 10/01/2015 1707   CHOLHDL 5.9 10/01/2015 1707   VLDL 22 10/01/2015 1707   LDLCALC 149 (H) 10/01/2015 1707   ASCVD 40.6% 10-year risk, no contraindications to high intensity statin.  Denies myalgias.  -Continue atorvastatin 40 mg daily

## 2016-04-20 NOTE — Assessment & Plan Note (Deleted)
CRC screening Prevnar 13 today

## 2016-04-20 NOTE — Progress Notes (Deleted)
   CC: ***  HPI:  Gabriel Sosa is a 76 y.o. man with history of HTN, AS, and GERD who presents for management of hypertension.  Please see A&P for status of the patient's chronic medical conditions.   Past Medical History:  Diagnosis Date  . GERD (gastroesophageal reflux disease)     Review of Systems:   ROS  Physical Exam:  There were no vitals filed for this visit. There is no height or weight on file to calculate BMI.  Physical Exam   Assessment & Plan:   See Encounters Tab for problem based charting.  Patient *** Dr. Marland Kitchen

## 2016-04-20 NOTE — Assessment & Plan Note (Deleted)
  Current medications: omeprazole 20 mg daily PRN

## 2016-04-24 ENCOUNTER — Encounter: Payer: Medicare Other | Admitting: Internal Medicine

## 2016-05-10 ENCOUNTER — Telehealth: Payer: Self-pay

## 2016-05-10 NOTE — Telephone Encounter (Signed)
Pharmacy requesting 90 day supply Atorvastatin

## 2016-05-10 NOTE — Telephone Encounter (Signed)
90 day ok for this script

## 2016-05-11 MED ORDER — ATORVASTATIN CALCIUM 40 MG PO TABS: 40.0000 mg | ORAL_TABLET | Freq: Every day | ORAL | 0 refills | Status: DC

## 2016-06-09 ENCOUNTER — Telehealth: Payer: Self-pay | Admitting: Internal Medicine

## 2016-06-09 NOTE — Telephone Encounter (Signed)
Needs appointment for further refills

## 2016-06-09 NOTE — Telephone Encounter (Signed)
Message sent to front desk to schedule pt an appt. 

## 2016-06-12 NOTE — Telephone Encounter (Signed)
Patient already had an appt scheduled with Dr. Peggyann Juba'Sullivan on next Monday 06-19-16 at 1:45 pm.

## 2016-06-19 ENCOUNTER — Encounter: Payer: Medicare Other | Admitting: Internal Medicine

## 2016-07-06 ENCOUNTER — Other Ambulatory Visit: Payer: Self-pay | Admitting: *Deleted

## 2016-07-06 MED ORDER — LISINOPRIL 10 MG PO TABS: 10.0000 mg | ORAL_TABLET | Freq: Every day | ORAL | 0 refills | Status: DC

## 2016-07-06 NOTE — Telephone Encounter (Signed)
[facility]

## 2016-07-17 ENCOUNTER — Ambulatory Visit (INDEPENDENT_AMBULATORY_CARE_PROVIDER_SITE_OTHER): Payer: Medicare Other | Admitting: Internal Medicine

## 2016-07-17 VITALS — BP 163/88 | HR 86 | Temp 98.4°F | Wt 236.4 lb

## 2016-07-17 DIAGNOSIS — I350 Nonrheumatic aortic (valve) stenosis: Secondary | ICD-10-CM | POA: Diagnosis not present

## 2016-07-17 DIAGNOSIS — Z23 Encounter for immunization: Secondary | ICD-10-CM | POA: Diagnosis not present

## 2016-07-17 DIAGNOSIS — Z79899 Other long term (current) drug therapy: Secondary | ICD-10-CM

## 2016-07-17 DIAGNOSIS — Z299 Encounter for prophylactic measures, unspecified: Secondary | ICD-10-CM

## 2016-07-17 DIAGNOSIS — I10 Essential (primary) hypertension: Secondary | ICD-10-CM | POA: Diagnosis not present

## 2016-07-17 DIAGNOSIS — Z87891 Personal history of nicotine dependence: Secondary | ICD-10-CM | POA: Diagnosis not present

## 2016-07-17 MED ORDER — OLMESARTAN-AMLODIPINE-HCTZ 40-5-25 MG PO TABS: ORAL_TABLET | ORAL | 2 refills | Status: DC

## 2016-07-17 NOTE — Assessment & Plan Note (Addendum)
Assessment: Aortic stenosis Echo on 10/2015 shows mild to moderate aortic stenosis with mild to moderate aortic regurgitation.  Patient states he is able to climb stairs, mow his grass and unload groceries without developing symptoms of shortness of breath or chest pain. Today, patient denies shortness of breath, chest pain or any syncopal episodes since his last clinic visit. I advised patient to monitor any changes listed above and told patient to call clinic if these develop. At this point will continue to monitor.  Plan -Reassess at next visit

## 2016-07-17 NOTE — Assessment & Plan Note (Signed)
Assessment: Essential hypertension Patient's blood pressure today is 163/88 and elevated. Goal is <140/80.  Patient reports compliance with lisinopril 10 mg and hydrochlorothiazide 25 mg daily. Since patient's previous 3 blood pressure readings in the clinic have also been elevated will add amlodipine to this visit and change lisinopril to olmesartan in order to due a triple combination pill.  Told patient to monitor blood pressures at home and to call the clinic if his blood pressure were to run low.  Plan -Olmesartan-amlodipine-HCTZ 40-5-25mg  daily

## 2016-07-17 NOTE — Patient Instructions (Signed)
[name],  Please start taking Olmesartan-amlodipine-HCTZ 40-5-25mg  combination pill daily.  Please stop taking lisinopril and hydrochlorothiazide.  Please call the clinic with any issues. Please follow up in 3 months. In the meantime please visit the acute care clinic for any acute medical needs.

## 2016-07-17 NOTE — Progress Notes (Signed)
   CC: Follow-up of essential hypertension  HPI:  [name] is a 76 y.o. with history of hypertension and aortic stenosis that presents to the acute care clinic for follow-up on essential hypertension. Patient reports compliance with lisinopril 10 mg and hydrochlorothiazide 25 mg daily. He denies any episodes of dizziness, lightheadedness or hypotension. Patient also has a history of aortic stenosis. He denies any recent changes in his breathing.  He states he is able to climb stairs without difficulty and do yard work such as mowing his grass and an using a Scientist, physiologicalweedeater. He is also able to unload the groceries without difficulty. He denies shortness of breath, chest pain or syncopal episodes.  Past Medical History:  Diagnosis Date  . GERD (gastroesophageal reflux disease)     Review of Systems:  Review of Systems  Constitutional: Negative for malaise/fatigue.  Respiratory: Negative for cough, shortness of breath and wheezing.   Cardiovascular: Negative for chest pain, palpitations, orthopnea and leg swelling.  Neurological: Negative for dizziness.     Physical Exam:  Vitals:   07/17/16 1322  BP: (!) 163/88  Pulse: 86  Temp: 98.4 F (36.9 C)  TempSrc: Oral  SpO2: 98%  Weight: 236 lb 6.4 oz (107.2 kg)   Physical Exam  Constitutional: He is well-developed, well-nourished, and in no distress.  Cardiovascular: Normal rate and regular rhythm.   3/6 systolic murmur   Pulmonary/Chest: Effort normal and breath sounds normal. No respiratory distress. He has no wheezes. He has no rales.  Musculoskeletal: He exhibits no edema.  Skin: Skin is warm and dry.    Assessment & Plan:   See encounters tab for problem based medical decision making.   Patient discussed with Dr. Criselda PeachesMullen

## 2016-07-18 NOTE — Progress Notes (Signed)
Internal Medicine Clinic Attending  Case discussed with Dr. Hoffman at the time of the visit.  We reviewed the resident's history and exam and pertinent patient test results.  I agree with the assessment, diagnosis, and plan of care documented in the resident's note.  

## 2016-08-23 ENCOUNTER — Other Ambulatory Visit: Payer: Self-pay | Admitting: *Deleted

## 2016-08-23 MED ORDER — ATORVASTATIN CALCIUM 40 MG PO TABS: 40.0000 mg | ORAL_TABLET | Freq: Every day | ORAL | 2 refills | Status: DC

## 2016-08-28 ENCOUNTER — Other Ambulatory Visit: Payer: Self-pay | Admitting: Internal Medicine

## 2016-08-28 NOTE — Telephone Encounter (Signed)
Refill Request    °  °atorvastatin (LIPITOR) 40 MG tablet °

## 2016-08-28 NOTE — Telephone Encounter (Signed)
Called pharm, they are working on his script as we speak, will notify him to pick up

## 2016-10-01 ENCOUNTER — Other Ambulatory Visit: Payer: Self-pay | Admitting: Internal Medicine

## 2016-10-06 ENCOUNTER — Other Ambulatory Visit: Payer: Self-pay | Admitting: Internal Medicine

## 2016-10-19 ENCOUNTER — Ambulatory Visit (INDEPENDENT_AMBULATORY_CARE_PROVIDER_SITE_OTHER): Payer: Medicare Other | Admitting: Internal Medicine

## 2016-10-19 ENCOUNTER — Encounter: Payer: Self-pay | Admitting: Internal Medicine

## 2016-10-19 DIAGNOSIS — M5416 Radiculopathy, lumbar region: Secondary | ICD-10-CM

## 2016-10-19 DIAGNOSIS — I350 Nonrheumatic aortic (valve) stenosis: Secondary | ICD-10-CM | POA: Diagnosis not present

## 2016-10-19 DIAGNOSIS — Z23 Encounter for immunization: Secondary | ICD-10-CM | POA: Diagnosis not present

## 2016-10-19 DIAGNOSIS — I10 Essential (primary) hypertension: Secondary | ICD-10-CM

## 2016-10-19 DIAGNOSIS — Z0000 Encounter for general adult medical examination without abnormal findings: Secondary | ICD-10-CM

## 2016-10-19 MED ORDER — CYCLOBENZAPRINE HCL 5 MG PO TABS: 5.0000 mg | ORAL_TABLET | Freq: Two times a day (BID) | ORAL | 1 refills | Status: AC | PRN

## 2016-10-19 MED ORDER — PSYLLIUM 63 % PO POWD: 3.0000 mL | Freq: Once | ORAL | 11 refills | Status: AC

## 2016-10-19 MED ORDER — ASPIRIN 81 MG PO TBEC
81.0000 mg | DELAYED_RELEASE_TABLET | Freq: Every day | ORAL | 11 refills | Status: AC
Start: 2016-10-19 — End: ?

## 2016-10-19 MED ORDER — OMEPRAZOLE MAGNESIUM 20 MG PO TBEC: 20.0000 mg | DELAYED_RELEASE_TABLET | Freq: Every day | ORAL | 11 refills | Status: AC | PRN

## 2016-10-19 MED ORDER — OLMESARTAN-AMLODIPINE-HCTZ 40-5-25 MG PO TABS: 1.0000 | ORAL_TABLET | Freq: Every day | ORAL | 4 refills | Status: DC

## 2016-10-19 MED ORDER — ATORVASTATIN CALCIUM 40 MG PO TABS: 40.0000 mg | ORAL_TABLET | Freq: Every day | ORAL | 11 refills | Status: DC

## 2016-10-19 NOTE — Progress Notes (Signed)
   CC: follow-up on essential hypertension  HPI:  [name] is a 76 y.o. male with history noted below the presence of the internal medicine clinic for follow-up on essential hypertension. Please see problem based charting for the status of patient's chronic medical conditions  Past Medical History:  Diagnosis Date  . GERD (gastroesophageal reflux disease)     Review of Systems:  Review of Systems  Respiratory: Negative for shortness of breath.   Cardiovascular: Negative for chest pain.  Musculoskeletal: Positive for back pain. Negative for falls.     Physical Exam:  Vitals:   10/19/16 1315  BP: (!) 143/83  Pulse: (!) 116  Temp: 98.4 F (36.9 C)  TempSrc: Oral  SpO2: 99%  Weight: 239 lb 9.6 oz (108.7 kg)  Height:  (1.854 m)   Physical Exam  Constitutional: He is well-developed, well-nourished, and in no distress.  Cardiovascular: Normal rate, regular rhythm and normal heart sounds.   Systolic, crescendo-decrescendo murmur  Pulmonary/Chest: Effort normal and breath sounds normal. No respiratory distress. He has no wheezes. He has no rales.  Musculoskeletal: He exhibits no edema.  5/5 motor strength bilaterally in lower extremities   Skin: Skin is warm and dry.    Assessment & Plan:   See encounters tab for problem based medical decision making.   Patient discussed with Dr. Heide Spark

## 2016-10-19 NOTE — Patient Instructions (Signed)
[name],  It was a pleasure seeing you today. Please start taking Flexeril to help with your back pain. Please call the clinic if you have any questions or concerns. Please follow up in 3 months.

## 2016-10-22 DIAGNOSIS — M5416 Radiculopathy, lumbar region: Secondary | ICD-10-CM

## 2016-10-22 HISTORY — DX: Radiculopathy, lumbar region: M54.16

## 2016-10-22 NOTE — Assessment & Plan Note (Signed)
Assessment: Lumbar radiculopathy Patient presents with a several week history of right-sided lower back pain. He states that he bent over to pick something up and felt instant pain in his lower right side that radiates to his thigh above the knee. He has tried NSAIDs with some benefit. He denies weakness, numbness or tingling or trauma. Encouraged continuing NSAIDs and stretches. Will add a muscle relaxant to help with symptoms.  Told to follow-up if symptoms get worse  Plan -NSAIDs -Flexeril twice a day prn

## 2016-10-22 NOTE — Assessment & Plan Note (Signed)
Assessment: Health maintenance Patient is due for colonoscopy. Discussed colon cancer screening with patient and states he would like to get a colonoscopy but wants to feel better from his back pain before scheduling. Also due for flu vaccine  Plan -Reassess at next visit -flu vaccine

## 2016-10-22 NOTE — Assessment & Plan Note (Signed)
Assessment: Essential hypertension On 07/17/2016 patient was started on olmesartan-amlodipine-HCTZ 40-5-25 milligrams daily after blood pressures were repeatedly over 140/80. Today patient's lood pressure is 123/73. Patient states that his blood pressures at home are in the 130s/80s.  Patient's goal blood pressure is < 140/80. Today's blood pressure is stable and at goal.  Plan -Continue olmesartan-amlodipine-HCTZ 40-5-25 mgs daily

## 2016-10-22 NOTE — Assessment & Plan Note (Signed)
Assessment:  Aortic stenosis Echo on 10/2015 shows mild-to-moderate aortic stenosis with mild to moderate aortic regurg. Since last clinic visit in 07/2016 patient has not had symptoms of shortness of breath, chest pain, or syncopal episodes. He states he is able to climb steps and does so on a daily basis.  Plan -reassess symptoms at next visit

## 2016-10-25 ENCOUNTER — Telehealth: Payer: Self-pay | Admitting: *Deleted

## 2016-10-25 NOTE — Telephone Encounter (Signed)
Fax received from pharmacy to request alternative for Cyclobenzaprine 5 mg that is not covered by patient's insurance.  Alternative medication is Titanidine HCL 4 mg tablets.  Message to be sent to Dr. Mila HomerJ. Hoffman to consider alternative medication for patient.  Angelina OkGladys Paraskevi Funez, RN 10/25/2016 12:10 PM.

## 2016-11-01 NOTE — Progress Notes (Signed)
Internal Medicine Clinic Attending  Case discussed with Dr. Hoffman at the time of the visit.  We reviewed the resident's history and exam and pertinent patient test results.  I agree with the assessment, diagnosis, and plan of care documented in the resident's note.  

## 2017-01-18 ENCOUNTER — Encounter: Payer: Medicare Other | Admitting: Internal Medicine

## 2017-02-08 ENCOUNTER — Encounter: Payer: Self-pay | Admitting: Internal Medicine

## 2017-02-08 ENCOUNTER — Ambulatory Visit: Payer: Medicare Other | Admitting: Internal Medicine

## 2017-02-08 ENCOUNTER — Other Ambulatory Visit: Payer: Self-pay

## 2017-02-08 VITALS — BP 139/88 | HR 84 | Temp 98.0°F | Ht 73.0 in | Wt 248.0 lb

## 2017-02-08 DIAGNOSIS — R011 Cardiac murmur, unspecified: Secondary | ICD-10-CM

## 2017-02-08 DIAGNOSIS — Z79899 Other long term (current) drug therapy: Secondary | ICD-10-CM

## 2017-02-08 DIAGNOSIS — R7309 Other abnormal glucose: Secondary | ICD-10-CM

## 2017-02-08 DIAGNOSIS — I10 Essential (primary) hypertension: Secondary | ICD-10-CM

## 2017-02-08 DIAGNOSIS — I352 Nonrheumatic aortic (valve) stenosis with insufficiency: Secondary | ICD-10-CM

## 2017-02-08 DIAGNOSIS — Z87891 Personal history of nicotine dependence: Secondary | ICD-10-CM | POA: Diagnosis not present

## 2017-02-08 DIAGNOSIS — I350 Nonrheumatic aortic (valve) stenosis: Secondary | ICD-10-CM

## 2017-02-08 DIAGNOSIS — Z0000 Encounter for general adult medical examination without abnormal findings: Secondary | ICD-10-CM

## 2017-02-08 LAB — POCT GLYCOSYLATED HEMOGLOBIN (HGB A1C): Hemoglobin A1C: 4.9

## 2017-02-08 LAB — GLUCOSE, CAPILLARY: Glucose-Capillary: 95 mg/dL (ref 65–99)

## 2017-02-08 NOTE — Patient Instructions (Signed)
[name],  It was so nice to see you today!  Please follow up with me in 6 months.  If you need to be seen sooner please don't hesitate to call the clinic.

## 2017-02-08 NOTE — Progress Notes (Signed)
   CC: follow up on essential hypertension  HPI:  [name] is a 77 y.o. male with history noted below that presents to the Internal Medicine Clinic for follow up on essential hypertension.  Please see problem based charting for the status of patient's chronic medical conditions.  Past Medical History:  Diagnosis Date  . GERD (gastroesophageal reflux disease)     Review of Systems:  Review of Systems  Constitutional: Negative for malaise/fatigue.  Respiratory: Negative for shortness of breath.   Cardiovascular: Negative for chest pain, palpitations and leg swelling.  Neurological: Negative for dizziness.     Physical Exam:  Vitals:   02/08/17 1532  BP: 139/88  Pulse: 84  Temp: 98 F (36.7 C)  TempSrc: Oral  SpO2: 100%  Weight: 248 lb (112.5 kg)  Height: 6\' 1"  (1.854 m)   Physical Exam  Constitutional: He is well-developed, well-nourished, and in no distress.  Cardiovascular: Normal rate and regular rhythm. Exam reveals no gallop and no friction rub.  Systolic, crescendo-decrescendo murmur   Pulmonary/Chest: Effort normal and breath sounds normal. No respiratory distress. He has no wheezes. He has no rales.     Assessment & Plan:   See encounters tab for problem based medical decision making.   Patient discussed with Dr. Rogelia BogaButcher

## 2017-02-09 LAB — BMP8+ANION GAP
Anion Gap: 15 mmol/L (ref 10.0–18.0)
BUN / CREAT RATIO: 23 (ref 10–24)
BUN: 22 mg/dL (ref 8–27)
CHLORIDE: 101 mmol/L (ref 96–106)
CO2: 24 mmol/L (ref 20–29)
Calcium: 9.5 mg/dL (ref 8.6–10.2)
Creatinine, Ser: 0.94 mg/dL (ref 0.76–1.27)
GFR calc non Af Amer: 79 mL/min/{1.73_m2} (ref >59)
GFR, EST AFRICAN AMERICAN: 91 mL/min/{1.73_m2} (ref >59)
GLUCOSE: 113 mg/dL — AB (ref 65–99)
POTASSIUM: 4.6 mmol/L (ref 3.5–5.2)
SODIUM: 140 mmol/L (ref 134–144)

## 2017-02-11 DIAGNOSIS — R7309 Other abnormal glucose: Secondary | ICD-10-CM | POA: Insufficient documentation

## 2017-02-11 HISTORY — DX: Other abnormal glucose: R73.09

## 2017-02-11 NOTE — Assessment & Plan Note (Signed)
Assessment:  Abnormal glucose Patient has had an elevated abnormal glucose in the past.  Checking hemoglobin A1C for assessment of diabetes.  Plan -hemoglobin A1C

## 2017-02-11 NOTE — Assessment & Plan Note (Signed)
Assessment: Health maintenance Patient is due for colonoscopy. Discussed colon cancer screening with patient and states he would like to wait.    Plan - re-assess at next visit

## 2017-02-11 NOTE — Assessment & Plan Note (Signed)
Assessment:  Aortic stenosis Echo on 10/2015 shows mild-to-moderate aortic stenosis with mild to moderate aortic regurg. Since last clinic visit patient has not had symptoms of shortness of breath, chest pain, or syncopal episodes. He states he is able to climb steps and does so on a daily basis without difficulty.  Plan -reassess symptoms at next visit - obtain echo at next visit - begin to discuss TAVR at next visit as an option if he does start to develop symptoms and has worsening AS

## 2017-02-11 NOTE — Assessment & Plan Note (Addendum)
Assessment: Essential hypertension Patient currently takes olmesartan-amlodipine-HCTZ 40-5-25 milligrams daily.  Today patient's lood pressure is 139/88. Patient states that his blood pressures at home are in the 130s/80s.  Patient's goal blood pressure is < 140/80. Today's blood pressure is stable and at goal.  Plan -Continue olmesartan-amlodipine-HCTZ 40-5-25 mgs daily  -BMET

## 2017-02-12 NOTE — Progress Notes (Signed)
Internal Medicine Clinic Attending  Case discussed with Dr. Hoffman at the time of the visit.  We reviewed the resident's history and exam and pertinent patient test results.  I agree with the assessment, diagnosis, and plan of care documented in the resident's note.  

## 2017-02-22 IMAGING — DX DG CHEST 2V
2 series · 2 of 2 positions shown · non-contrast
Comparison: No recent prior .

CLINICAL DATA: Hypertension.

EXAM:
CHEST  2 VIEW

[chest pa]
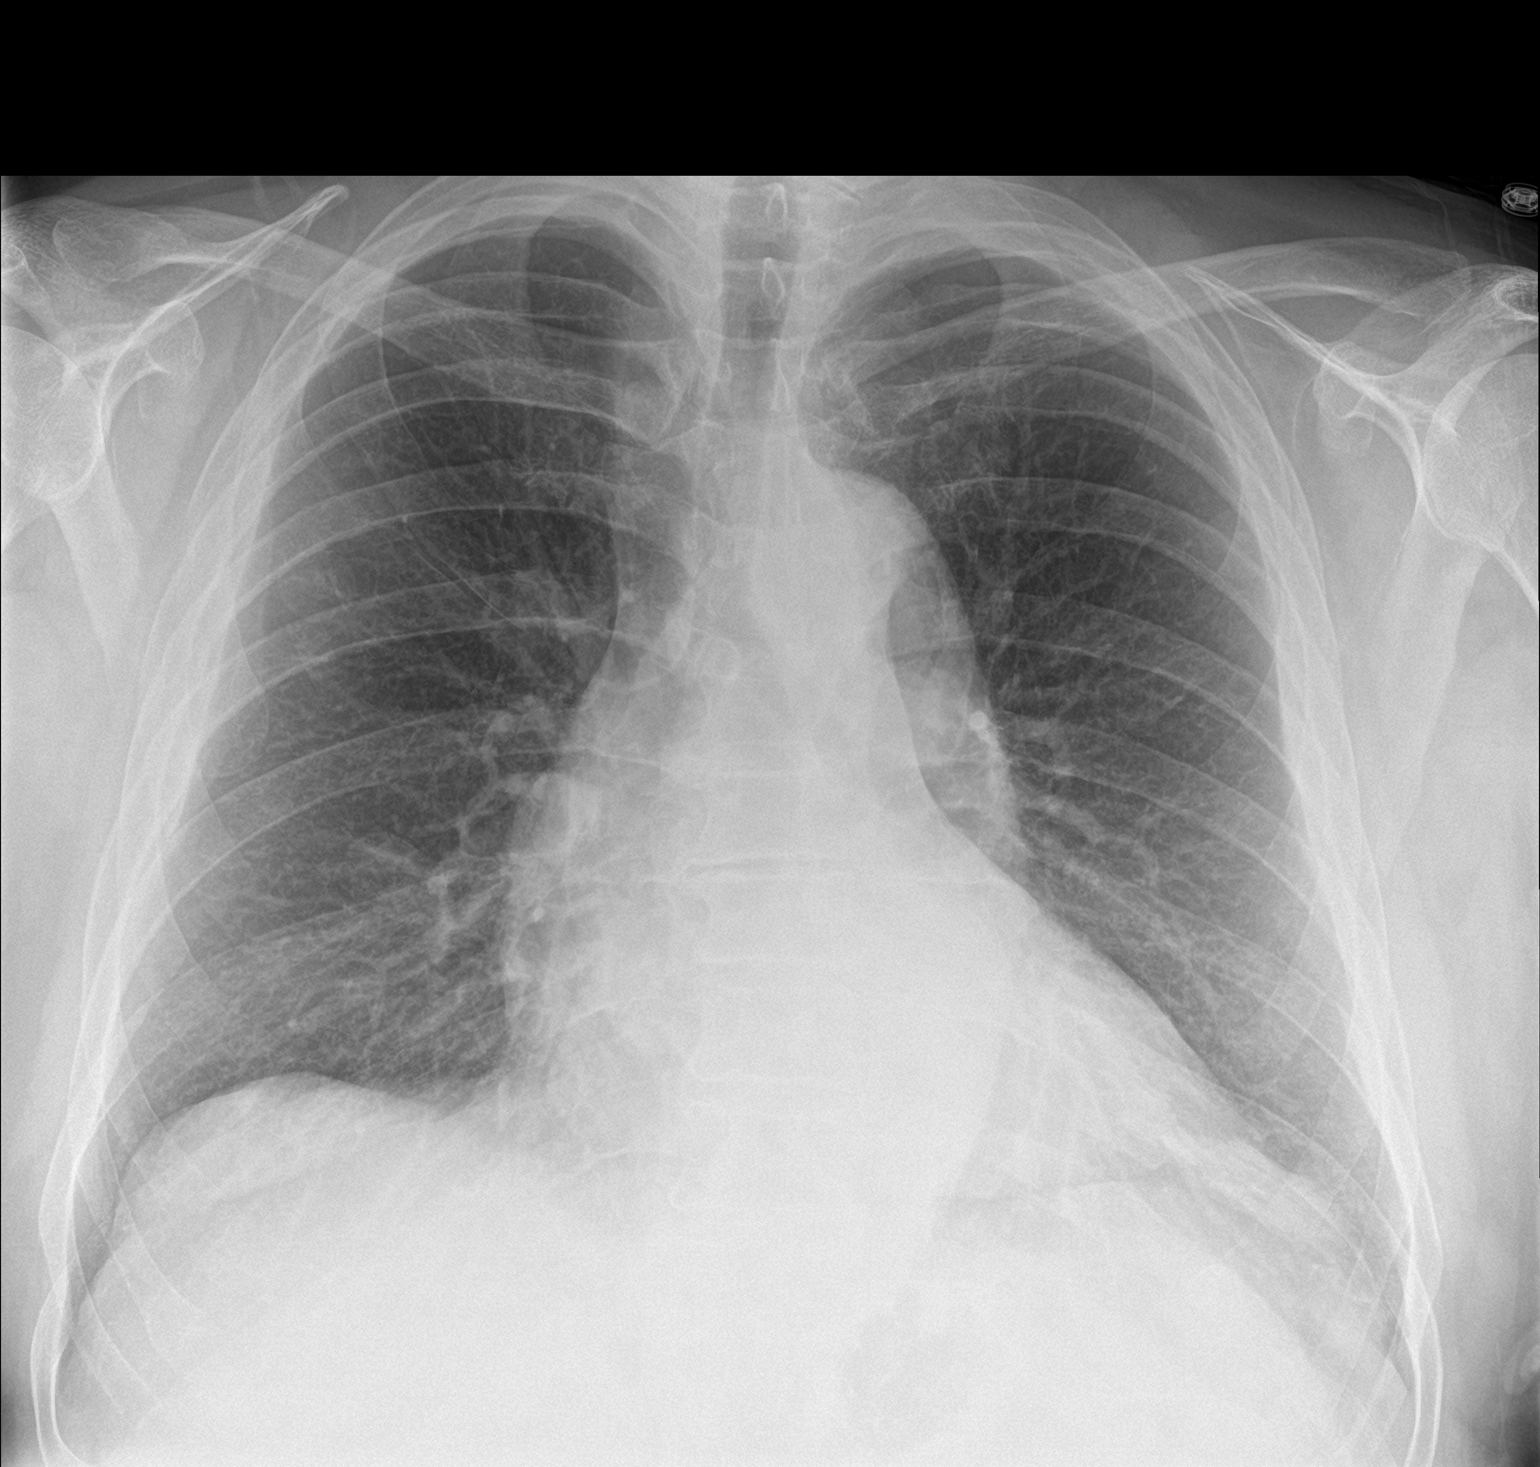

[chest lat]
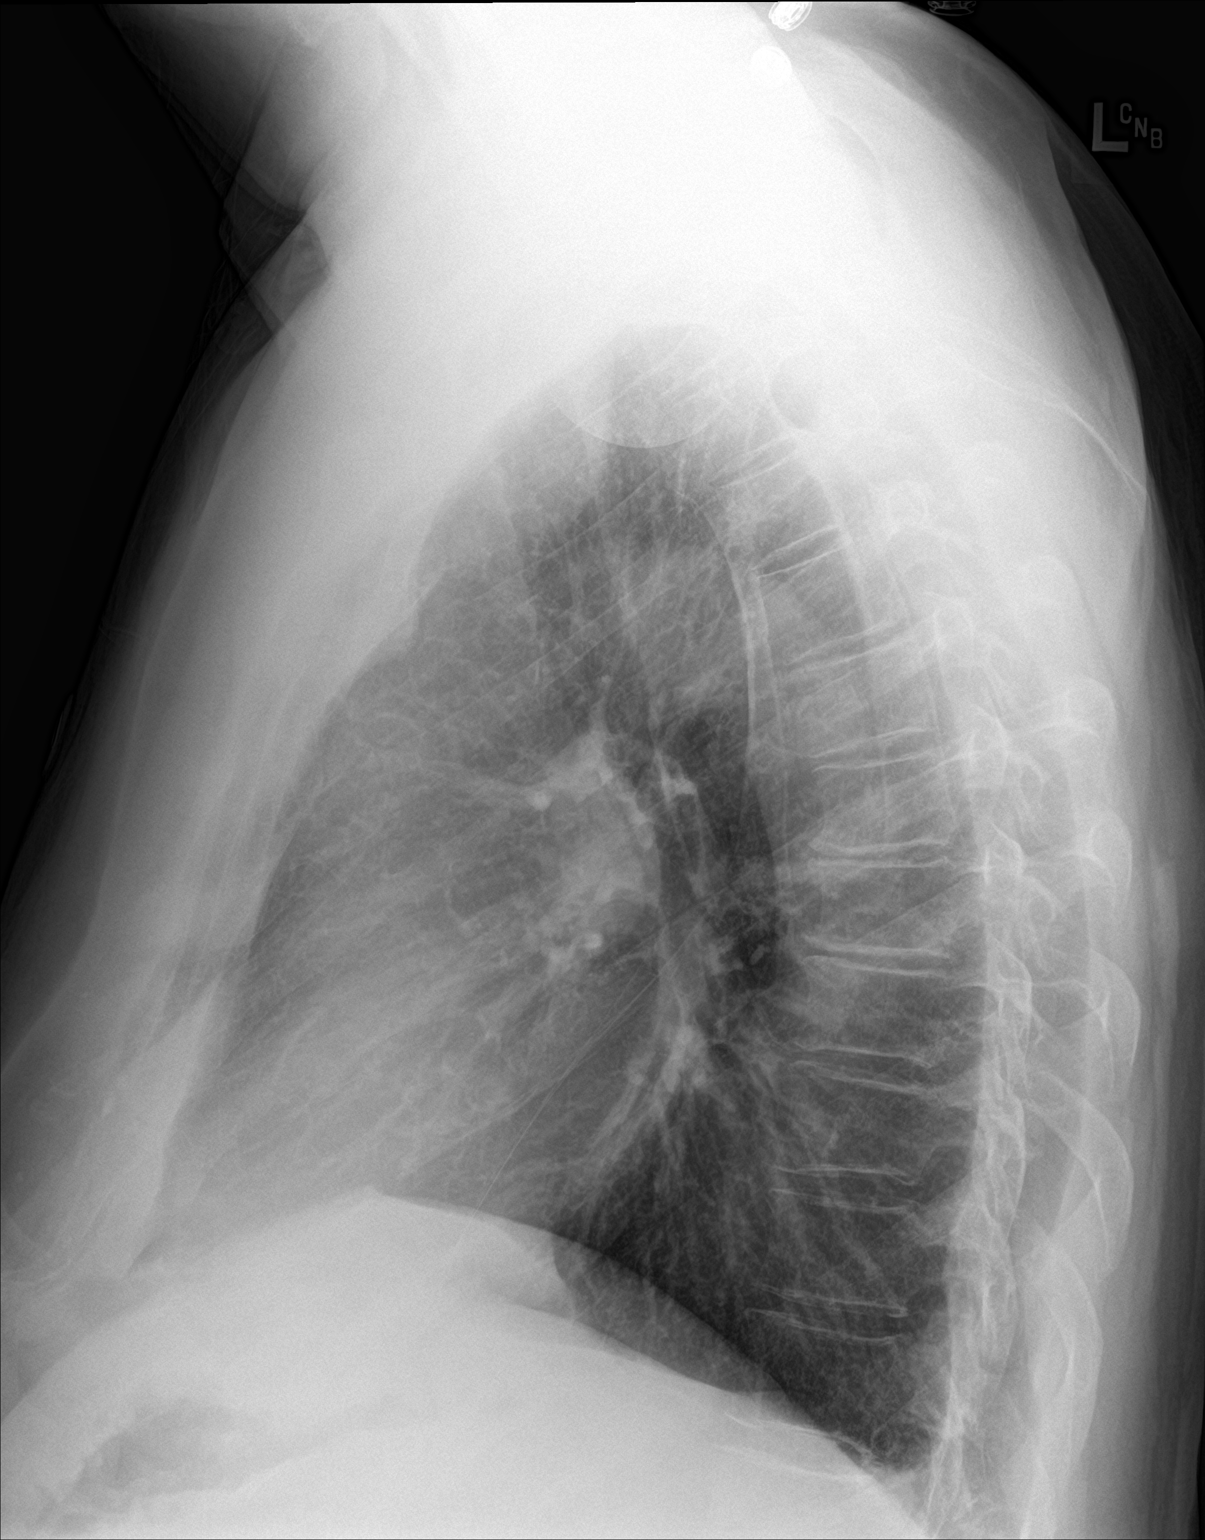

[2 of 2 positions shown; findings below may reference images not displayed]

FINDINGS: Mediastinum and hilar structures normal. Cardiomegaly with normal
pulmonary vascularity. Low lung volumes with mild bibasilar
atelectasis and/or infiltrate.
IMPRESSION: 1.  Low lung volumes with mild bibasilar atelectasis.

2. Cardiomegaly.  No pulmonary venous congestion .

## 2017-11-09 ENCOUNTER — Other Ambulatory Visit: Payer: Self-pay | Admitting: Internal Medicine

## 2017-11-12 NOTE — Telephone Encounter (Signed)
Patient will have to wait for Dr. Mikey Bussing in the month of December when she is in [facility].  I am unable to schedule that appointment at this time.

## 2017-11-15 ENCOUNTER — Other Ambulatory Visit: Payer: Self-pay | Admitting: Internal Medicine

## 2017-11-16 ENCOUNTER — Other Ambulatory Visit: Payer: Self-pay | Admitting: Internal Medicine

## 2017-11-19 NOTE — Telephone Encounter (Signed)
Pt appt 12/03/2017 @ 2:45.

## 2017-11-20 ENCOUNTER — Other Ambulatory Visit: Payer: Self-pay | Admitting: Internal Medicine

## 2017-11-20 DIAGNOSIS — I350 Nonrheumatic aortic (valve) stenosis: Secondary | ICD-10-CM

## 2017-11-20 NOTE — Telephone Encounter (Signed)
Could you please let [name] know that I ordered for him to have an Echo to assess his aortic stenosis and if possible to have it done before his appointment.  Thanks.   Also is it possible to reschedule him for Dec?  [place] except thursdays.  I would prefer to see him if possible.  Thanks

## 2017-11-20 NOTE — Telephone Encounter (Signed)
Spoke to pt, made him aware of dr Gap Inchoffman's advisement, will send to front office for appt and to chilonb. In case prior approval from insurance is needed for echo

## 2017-12-03 ENCOUNTER — Ambulatory Visit: Payer: Medicare Other

## 2017-12-12 ENCOUNTER — Ambulatory Visit (HOSPITAL_COMMUNITY)
Admission: RE | Admit: 2017-12-12 | Discharge: 2017-12-12 | Disposition: A | Discharge status: Home / Self Care | Payer: Medicare Other | Source: Ambulatory Visit | Attending: Internal Medicine | Admitting: Internal Medicine

## 2017-12-12 ENCOUNTER — Ambulatory Visit (INDEPENDENT_AMBULATORY_CARE_PROVIDER_SITE_OTHER): Payer: Medicare Other | Admitting: *Deleted

## 2017-12-12 VITALS — BP 130/81 | HR 92

## 2017-12-12 DIAGNOSIS — E785 Hyperlipidemia, unspecified: Secondary | ICD-10-CM | POA: Diagnosis not present

## 2017-12-12 DIAGNOSIS — I350 Nonrheumatic aortic (valve) stenosis: Secondary | ICD-10-CM | POA: Diagnosis not present

## 2017-12-12 DIAGNOSIS — Z23 Encounter for immunization: Secondary | ICD-10-CM | POA: Diagnosis not present

## 2017-12-12 DIAGNOSIS — I10 Essential (primary) hypertension: Secondary | ICD-10-CM | POA: Insufficient documentation

## 2017-12-12 DIAGNOSIS — Z0130 Encounter for examination of blood pressure without abnormal findings: Secondary | ICD-10-CM

## 2017-12-12 NOTE — Progress Notes (Signed)
Patient walked in asking for hi dose flu vaccine and BP check. Vaccine administered without incident.  BP 130/81 and P 92. Patient made appt for 5 days to discuss echo results. Gabriel Sosa. Shanautica Forker, RN, BSN

## 2017-12-12 NOTE — Progress Notes (Signed)
  Echocardiogram 2D Echocardiogram has been performed.  Maxwel Meadowcroft L Androw 12/12/2017, 2:51 PM

## 2017-12-18 ENCOUNTER — Other Ambulatory Visit: Payer: Self-pay

## 2017-12-18 ENCOUNTER — Ambulatory Visit (INDEPENDENT_AMBULATORY_CARE_PROVIDER_SITE_OTHER): Payer: Medicare Other | Admitting: Internal Medicine

## 2017-12-18 VITALS — BP 132/83 | HR 84 | Temp 98.6°F | Wt 248.6 lb

## 2017-12-18 DIAGNOSIS — Z23 Encounter for immunization: Secondary | ICD-10-CM | POA: Diagnosis not present

## 2017-12-18 DIAGNOSIS — I10 Essential (primary) hypertension: Secondary | ICD-10-CM

## 2017-12-18 DIAGNOSIS — R011 Cardiac murmur, unspecified: Secondary | ICD-10-CM | POA: Diagnosis not present

## 2017-12-18 DIAGNOSIS — Z79899 Other long term (current) drug therapy: Secondary | ICD-10-CM | POA: Diagnosis not present

## 2017-12-18 DIAGNOSIS — Z0000 Encounter for general adult medical examination without abnormal findings: Secondary | ICD-10-CM

## 2017-12-18 DIAGNOSIS — I350 Nonrheumatic aortic (valve) stenosis: Secondary | ICD-10-CM

## 2017-12-18 MED ORDER — OLMESARTAN-AMLODIPINE-HCTZ 40-5-25 MG PO TABS: 1.0000 | ORAL_TABLET | Freq: Every day | ORAL | 2 refills | Status: DC

## 2017-12-18 NOTE — Assessment & Plan Note (Signed)
Assessment: Healthcare maintenance Patient has not had a pneumococcal vaccine and would like the vaccine today. Started with Prevnar 13.  Plan -Prevnar 13

## 2017-12-18 NOTE — Progress Notes (Signed)
   CC: Follow up on aortic stenosis and hypertension  HPI:  [name] is a 77 y.o. male with history noted below that presents to the internal medicine clinic for follow-up  on aortic stenosis and hypertension. Please see problem based charting for the status of patient's chronic medical conditions.  Past Medical History:  Diagnosis Date  . GERD (gastroesophageal reflux disease)     Review of Systems:  Review of Systems  Respiratory: Negative for cough and shortness of breath.   Cardiovascular: Negative for chest pain, orthopnea and leg swelling.     Physical Exam:  Vitals:   12/18/17 0835  BP: 132/83  Pulse: 84  Temp: 98.6 F (37 C)  TempSrc: Oral  SpO2: 100%  Weight: 248 lb 9.6 oz (112.8 kg)   Physical Exam  Constitutional: He is well-developed, well-nourished, and in no distress.  Cardiovascular: Normal rate.  III/VI Crescendo/decrecendo systolic murmur heard best at the right upper sternal border  Pulmonary/Chest: Effort normal and breath sounds normal.     Assessment & Plan:   See encounters tab for problem based medical decision making.   Patient discussed with Dr. Sandre Sosa

## 2017-12-18 NOTE — Assessment & Plan Note (Signed)
Assessment: Aortic stenosis  Patient denies any symptoms such as shortness of breath, presyncope/syncope or exertional angina.  Patient had an echo on 10/2015 that showed severely thickened and calcified aortic valve with mild to moderate aortic stenosis.  The patient had a repeat echo to see the progression of his aortic stenosis on 12/12/2017.  The echo showed worsening of aortic stenosis now moderate to severe range. At this time will refer patient to cardiology.  Discussed TAVI as a possible option for his aortic stenosis.  Plan -refer to cardiology

## 2017-12-18 NOTE — Patient Instructions (Signed)
[name],  It was nice to see you today. A referral to cardiology has been made. Please follow-up either in May or June.  Or sooner as needed.

## 2017-12-18 NOTE — Assessment & Plan Note (Addendum)
Assessment: Hypertension Patient currently takes olmesartan-amlodipine-HCTZ 40-5-25 mg daily and blood pressure today is 132/83, stable and well controlled.    Plan -refill current blood pressure medication -Bmet

## 2017-12-19 LAB — BMP8+ANION GAP
Anion Gap: 18 mmol/L (ref 10.0–18.0)
BUN/Creatinine Ratio: 18 (ref 10–24)
BUN: 20 mg/dL (ref 8–27)
CO2: 21 mmol/L (ref 20–29)
Calcium: 9.8 mg/dL (ref 8.6–10.2)
Chloride: 104 mmol/L (ref 96–106)
Creatinine, Ser: 1.1 mg/dL (ref 0.76–1.27)
GFR calc Af Amer: 75 mL/min/{1.73_m2} (ref >59)
GFR calc non Af Amer: 65 mL/min/{1.73_m2} (ref >59)
Glucose: 118 mg/dL — ABNORMAL HIGH (ref 65–99)
Potassium: 5.1 mmol/L (ref 3.5–5.2)
Sodium: 143 mmol/L (ref 134–144)

## 2018-01-15 NOTE — Progress Notes (Signed)
Internal Medicine Clinic Attending  Case discussed with Dr. Hoffman at the time of the visit.  We reviewed the resident's history and exam and pertinent patient test results.  I agree with the assessment, diagnosis, and plan of care documented in the resident's note.  Alexander Raines, M.D., Ph.D.  

## 2018-01-18 NOTE — Progress Notes (Signed)
Cardiology Office Note   Date:  01/21/2018   ID:  Montez HagemanWilliam Dallas [place], DOB 02/20/1940, MRN [id number]  PCP:  Camelia PhenesHoffman, Jessica Ratliff, DO  Cardiologist:   Charlton HawsPeter , MD   No chief complaint on file.     History of Present Illness: Gabriel EvensWilliam Dallas Lasser is a 78 y.o. male who presents for consultation regarding aortic stenosis. Referred by Dr Mikey BussingHoffman. History of murmur and HTN. Reviewed last TTE done 12/12/17  EF 65-70% mean gradient 35 mmHg peak 65 mmhg  AVA 1.05 cm2 DVI .47  There has been some progression since TTE done 10/2015  When mean gradient was 20 mmHg and peak 32 mmHg.  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 Abnormal glucose 02/11/2017  . Aortic stenosis    Echocardiogram 10/11/2015 Possibly bicuspid aortic valve,severely thickened and calcified with mild to moderate aortic stenosis and mild to moderate aortic regurgitation.  3/6 systolic murmur  . Cardiomegaly 10/01/2015  . Chest pain 10/01/2015  . DOE (dyspnea on exertion) 10/01/2015  . Gastroesophageal reflux disease without esophagitis   . GERD (gastroesophageal reflux disease)   . HTN (hypertension) 10/01/2015  . Hyperlipidemia   . Lumbar radiculopathy 10/22/2016  . Obesity 10/01/2015    No past surgical history on file.   Current Outpatient Medications  Medication Sig Dispense Refill  . aspirin 81 MG EC tablet Take 1 tablet (81 mg total) by mouth daily. 30 tablet 11  . atorvastatin (LIPITOR) 40 MG tablet TAKE 1 TABLET (40 MG TOTAL) BY MOUTH DAILY AT 6 PM. 90 tablet 3  . cyclobenzaprine (FLEXERIL) 5 MG tablet Take 1 tablet (5 mg total) by mouth 2 (two) times  daily as needed for muscle spasms. 60 tablet 1  . Olmesartan-amLODIPine-HCTZ 40-5-25 MG TABS Take 1 tablet by mouth daily. 180 tablet 2  . omeprazole (PRILOSEC OTC) 20 MG tablet Take 1 tablet (20 mg total) by mouth daily as needed (Heart burn). 30 tablet 11   No current facility-administered medications for this visit.     Allergies:   Patient has no known allergies.    Social History:  The patient  reports that he quit smoking about 60 years ago. His smoking use included cigarettes. He has a 4.00 pack-year smoking history. He has never used smokeless tobacco. He reports that he does not drink alcohol or use drugs.   Family History:  The patient's family history includes Breast cancer in his sister; CAD in his mother; Dementia in his mother; Diabetes in his mother.    ROS:  Please see the history of present illness.   Otherwise, review of systems are positive for none.   All other systems are reviewed and negative.    PHYSICAL EXAM: VS:  BP 124/82   Pulse 91   Ht 6\' 1"  (1.854 m)   Wt 247 lb (112 kg)   BMI 32.59 kg/m  , BMI Body mass index is 32.59 kg/m. Affect appropriate Healthy:  appears stated age HEENT: normal Neck supple with no adenopathy JVP normal no bruits no thyromegaly Lungs clear with no wheezing and good diaphragmatic motion Heart:  S1/S2 muffled AS murmur, no rub, gallop or click PMI normal Abdomen: benighn, BS positve, no tenderness, no AAA no bruit.  No HSM or HJR Distal pulses intact with no bruits No edema Neuro non-focal Skin warm and dry No muscular weakness    EKG:  10/02/15 SR rate 78 normal    Recent Labs: 12/18/2017: BUN 20; Creatinine, Ser 1.10; Potassium 5.1; Sodium 143    Lipid Panel    Component Value Date/Time   CHOL 206 (H) 10/01/2015 1707   TRIG 110 10/01/2015 1707   HDL 35 (L) 10/01/2015 1707   CHOLHDL 5.9 10/01/2015 1707   VLDL 22 10/01/2015 1707   LDLCALC 149 (H) 10/01/2015 1707      Wt Readings from Last 3 Encounters:    01/21/18 247 lb (112 kg)  12/18/17 248 lb 9.6 oz (112.8 kg)  02/08/17 248 lb (112.5 kg)      Other studies Reviewed: Additional studies/ records that were reviewed today include: notes from primary labs TTE done 12/2017 and October 2017.    ASSESSMENT AND PLAN:  1.  Aortic Stenosis:  Moderate to severe f/u TTE in a year Asymptomatic significant progression over 2 years. Likely need AVR in next 2 years. Will order exercise myovue to r/o CAD He will see dentist  2.  HTN:  Well controlled.  Continue current medications and low sodium Dash type diet.   3. HLD:  Continue statin labs with primary    Current medicines are reviewed at length with the patient today.  The patient does not have concerns regarding medicines.  The following changes have been made:  no change  Labs/ tests ordered today include: exercise myovue   Orders Placed This Encounter  Procedures  . MYOCARDIAL PERFUSION IMAGING  . EKG 12-Lead  . ECHOCARDIOGRAM COMPLETE     Disposition:   FU with me in a year with TTE for AS     Signed, Charlton Haws, MD  01/21/2018 4:47 PM    [facility] [address] [place], [place], [place]  [address] Phone: [phone]; Fax: [phone]

## 2018-01-21 ENCOUNTER — Ambulatory Visit: Payer: Medicare Other | Admitting: Cardiovascular Disease

## 2018-01-21 VITALS — BP 124/82 | HR 91 | Ht 73.0 in | Wt 247.0 lb

## 2018-01-21 DIAGNOSIS — E785 Hyperlipidemia, unspecified: Secondary | ICD-10-CM | POA: Diagnosis not present

## 2018-01-21 DIAGNOSIS — I10 Essential (primary) hypertension: Secondary | ICD-10-CM

## 2018-01-21 DIAGNOSIS — I350 Nonrheumatic aortic (valve) stenosis: Secondary | ICD-10-CM

## 2018-01-21 NOTE — Patient Instructions (Addendum)
Medication Instructions:   If you need a refill on your cardiac medications before your next appointment, please call your pharmacy.   Lab work:  If you have labs (blood work) drawn today and your tests are completely normal, you will receive your results only by: Marland Kitchen MyChart Message (if you have MyChart) OR . A paper copy in the mail If you have any lab test that is abnormal or we need to change your treatment, we will call you to review the results.  Testing/Procedures: Your physician has requested that you have en exercise stress myoview. For further information please visit [email or web address] Please follow instruction sheet, as given.  Your physician has requested that you have an echocardiogram in 1 year with office visit. Echocardiography is a painless test that uses sound waves to create images of your heart. It provides your doctor with information about the size and shape of your heart and how well your heart's chambers and valves are working. This procedure takes approximately one hour. There are no restrictions for this procedure.  Follow-Up: At [facility], you and your health needs are our priority.  As part of our continuing mission to provide you with exceptional heart care, we have created designated Provider Care Teams.  These Care Teams include your primary Cardiologist (physician) and Advanced Practice Providers (APPs -  Physician Assistants and Nurse Practitioners) who all work together to provide you with the care you need, when you need it. You will need a follow up appointment in 12 months.  Please call our office 2 months in advance to schedule this appointment.  You may see Dr. Eden Emms or one of the following Advanced Practice Providers on your designated Care Team:   Norma Fredrickson, NP Nada Boozer, NP . Georgie Chard, NP

## 2018-01-24 ENCOUNTER — Telehealth (HOSPITAL_COMMUNITY): Payer: Self-pay | Admitting: *Deleted

## 2018-01-24 ENCOUNTER — Telehealth (HOSPITAL_COMMUNITY): Payer: Self-pay | Admitting: Radiology

## 2018-01-24 NOTE — Telephone Encounter (Signed)
Left message on voicemail in reference to upcoming appointment scheduled for1/20/20. Phone number given for a call back so details instructions can be given. Gabriel Sosa Jacqueline  

## 2018-01-24 NOTE — Telephone Encounter (Signed)
Patient given detailed instructions per Myocardial Perfusion Study Information Sheet for the test on 01/28/2018 at 8:15. Patient notified to arrive 15 minutes early and that it is imperative to arrive on time for appointment to keep from having the test rescheduled.  If you need to cancel or reschedule your appointment, please call the office within 24 hours of your appointment. . Patient verbalized understanding.EHK

## 2018-01-28 ENCOUNTER — Ambulatory Visit (HOSPITAL_COMMUNITY): Payer: Medicare Other | Attending: Cardiology

## 2018-01-28 DIAGNOSIS — I10 Essential (primary) hypertension: Secondary | ICD-10-CM | POA: Diagnosis not present

## 2018-01-28 DIAGNOSIS — I350 Nonrheumatic aortic (valve) stenosis: Secondary | ICD-10-CM | POA: Insufficient documentation

## 2018-01-28 DIAGNOSIS — E785 Hyperlipidemia, unspecified: Secondary | ICD-10-CM

## 2018-01-28 LAB — MYOCARDIAL PERFUSION IMAGING
CSEPPHR: 117 {beats}/min
LV dias vol: 65 mL (ref 62–150)
LV sys vol: 30 mL
Rest HR: 90 {beats}/min
SDS: 0
SRS: 0
SSS: 0
TID: 1.02

## 2018-01-28 MED ORDER — TECHNETIUM TC 99M TETROFOSMIN IV KIT
33.0000 | PACK | Freq: Once | INTRAVENOUS | Status: AC | PRN
  Administered 2018-01-28: 33 via INTRAVENOUS
  Filled 2018-01-28: qty 33

## 2018-01-28 MED ORDER — REGADENOSON 0.4 MG/5ML IV SOLN
0.4000 mg | Freq: Once | INTRAVENOUS | Status: AC
  Administered 2018-01-28: 0.4 mg via INTRAVENOUS

## 2018-01-28 MED ORDER — TECHNETIUM TC 99M TETROFOSMIN IV KIT
10.9000 | PACK | Freq: Once | INTRAVENOUS | Status: AC | PRN
  Administered 2018-01-28: 10.9 via INTRAVENOUS
  Filled 2018-01-28: qty 11

## 2018-05-13 ENCOUNTER — Other Ambulatory Visit: Payer: Self-pay

## 2018-05-13 NOTE — Telephone Encounter (Signed)
Called pharm, he has refills available, they will fill now and call him

## 2018-05-13 NOTE — Telephone Encounter (Signed)
Olmesartan-amLODIPine-HCTZ 40-5-25 MG TABS, refill request @ [facility] on [address] [facility] [place], [place].

## 2018-06-13 ENCOUNTER — Other Ambulatory Visit: Payer: Self-pay

## 2018-06-13 ENCOUNTER — Ambulatory Visit (INDEPENDENT_AMBULATORY_CARE_PROVIDER_SITE_OTHER): Payer: Medicare Other | Admitting: Internal Medicine

## 2018-06-13 DIAGNOSIS — I350 Nonrheumatic aortic (valve) stenosis: Secondary | ICD-10-CM

## 2018-06-13 DIAGNOSIS — I10 Essential (primary) hypertension: Secondary | ICD-10-CM | POA: Diagnosis not present

## 2018-06-13 NOTE — Progress Notes (Signed)
  [facility] Internal Medicine Residency Telephone Encounter Continuity Care Appointment  HPI:   This telephone encounter was created for [name] on 06/13/2018 for the following purpose/cc essential hypertension.   Past Medical History:  Past Medical History:  Diagnosis Date  . Abnormal glucose 02/11/2017  . Aortic stenosis    Echocardiogram 10/11/2015 Possibly bicuspid aortic valve,severely thickened and calcified with mild to moderate aortic stenosis and mild to moderate aortic regurgitation.  3/6 systolic murmur  . Cardiomegaly 10/01/2015  . Chest pain 10/01/2015  . DOE (dyspnea on exertion) 10/01/2015  . Gastroesophageal reflux disease without esophagitis   . GERD (gastroesophageal reflux disease)   . HTN (hypertension) 10/01/2015  . Hyperlipidemia   . Lumbar radiculopathy 10/22/2016  . Obesity 10/01/2015      ROS:  Review of Systems  Constitutional: Negative for chills and fever.  Respiratory: Negative for cough and shortness of breath.   Cardiovascular: Negative for chest pain.     Assessment / Plan / Recommendations:   Please see A&P under problem oriented charting for assessment of the patient's acute and chronic medical conditions.   As always, pt is advised that if symptoms worsen or new symptoms arise, they should go to an urgent care facility or to to ER for further evaluation.   Consent and Medical Decision Making:   Patient discussed with Dr. Oswaldo Sosa  This is a telephone encounter between [facility] and Gabriel Sosa on 06/13/2018 for essential hypertension. The visit was conducted with the patient located at home and Gabriel Sosa at [facility]. The patient's identity was confirmed using their DOB and current address. The patient has consented to being evaluated through a telephone encounter and understands the associated risks (an examination cannot be Sosa and the patient may need to come in for an appointment) / benefits (allows the  patient to remain at home, decreasing exposure to coronavirus). I personally spent 16 minutes on medical discussion.

## 2018-06-14 NOTE — Assessment & Plan Note (Signed)
HPI: Patient has a history of aortic stenosis and denies dyspnea on exertion, presyncope/syncope or exertional angina since last visit  Assessment: Moderate to severe aortic stenosis Patient was referred to cardiology at last office visit on 12/2017.  He is following with cardiology for possible TAVI in the near future.  Plan -Continue to monitor symptoms of aortic stenosis -Continue to follow with cardiology

## 2018-06-14 NOTE — Assessment & Plan Note (Signed)
HPI: Patient reports adherence to Tribenzor 40-5- 25 mg daily.  He states that he is able to take his blood pressure at home and the last 4 readings were 116/74, 120/74, 132/76, 138/72.  He denies any dizziness lightheadedness or side effects from the medication.  However he is concerned that his blood pressure was a little low at 116/74.  Assessment: Controlled essential hypertension Blood pressures in the office have been between 120 and 139 systolic and 80s diastolic.  Readings are consistent at home with office readings.  When patient reported 116/74 he did not have symptoms.  Discussed with patient plan of continuing current dose as his blood pressures are well controlled and he is asymptomatic.  If patient continues to have low blood pressure readings and has symptoms can cut the diuretic portion in half to 12.5 mg.  Patient was agreeable.  Plan -Continue Tribenzor 40-5-25mg  daily

## 2018-06-17 NOTE — Progress Notes (Signed)
Internal Medicine Clinic Attending  Case discussed with Dr. Hoffman at the time of the visit.  We reviewed the resident's history and exam and pertinent patient test results.  I agree with the assessment, diagnosis, and plan of care documented in the resident's note.  

## 2018-07-08 ENCOUNTER — Encounter: Payer: Self-pay | Admitting: *Deleted

## 2018-11-15 ENCOUNTER — Other Ambulatory Visit: Payer: Self-pay | Admitting: *Deleted

## 2018-11-18 MED ORDER — ATORVASTATIN CALCIUM 40 MG PO TABS: 40.0000 mg | ORAL_TABLET | Freq: Every day | ORAL | 3 refills | Status: DC

## 2018-11-28 ENCOUNTER — Ambulatory Visit (INDEPENDENT_AMBULATORY_CARE_PROVIDER_SITE_OTHER): Payer: Medicare Other | Admitting: Internal Medicine

## 2018-11-28 ENCOUNTER — Encounter: Payer: Self-pay | Admitting: Internal Medicine

## 2018-11-28 ENCOUNTER — Other Ambulatory Visit: Payer: Self-pay

## 2018-11-28 DIAGNOSIS — Z0000 Encounter for general adult medical examination without abnormal findings: Secondary | ICD-10-CM

## 2018-11-28 DIAGNOSIS — I10 Essential (primary) hypertension: Secondary | ICD-10-CM

## 2018-11-28 DIAGNOSIS — Z79899 Other long term (current) drug therapy: Secondary | ICD-10-CM | POA: Diagnosis not present

## 2018-11-28 DIAGNOSIS — Z8619 Personal history of other infectious and parasitic diseases: Secondary | ICD-10-CM

## 2018-11-28 NOTE — Progress Notes (Signed)
  [facility] Telephone Encounter Continuity Care Appointment  HPI:   This telephone encounter was created for [name] on 11/28/2018 for the following purpose/cc hypertension.   Past Medical History:  Past Medical History:  Diagnosis Date  . Abnormal glucose 02/11/2017  . Aortic stenosis    Echocardiogram 10/11/2015 Possibly bicuspid aortic valve,severely thickened and calcified with mild to moderate aortic stenosis and mild to moderate aortic regurgitation.  3/6 systolic murmur  . Cardiomegaly 10/01/2015  . Chest pain 10/01/2015  . DOE (dyspnea on exertion) 10/01/2015  . Gastroesophageal reflux disease without esophagitis   . GERD (gastroesophageal reflux disease)   . HTN (hypertension) 10/01/2015  . Hyperlipidemia   . Lumbar radiculopathy 10/22/2016  . Obesity 10/01/2015      ROS:  Review of Systems  Constitutional: Negative for chills, fever and weight loss.  HENT: Negative for hearing loss.   Eyes: Negative for blurred vision and double vision.  Respiratory: Negative for cough, shortness of breath and wheezing.   Cardiovascular: Negative for chest pain and palpitations.  Gastrointestinal: Negative for abdominal pain, constipation, diarrhea, nausea and vomiting.  Musculoskeletal: Negative for myalgias.  Neurological: Negative for headaches.     Assessment / Plan / Recommendations:   Please see A&P under problem oriented charting for assessment of the patient's acute and chronic medical conditions.   As always, pt is advised that if symptoms worsen or new symptoms arise, they should go to an urgent care facility or to to ER for further evaluation.   Consent and Medical Decision Making:   Patient seen with Dr. Dareen Piano  This is a telephone encounter between [facility] and Maudie Mercury on 11/28/2018 for hypertension. The visit was conducted with the patient located at home and Maudie Mercury at [facility]. The patient's  identity was confirmed using their DOB and current address. The patient has consented to being evaluated through a telephone encounter and understands the associated risks (an examination cannot be done and the patient may need to come in for an appointment) / benefits (allows the patient to remain at home, decreasing exposure to coronavirus). I personally spent 17 minutes on medical discussion.

## 2018-11-28 NOTE — Assessment & Plan Note (Signed)
Spoke with patient during this telehealth visit concerning vaccinations. Patient states that he got his flu vaccine and pneumovax 23 vaccine. Patient inquired about shingles vaccination. Patient states that he did have chicken pox as an adolescent. We discussed the pertinent positives and negatives, and it was agreed upon to receive the vaccine.  P:  Patient will receive the Shingles vaccine at his local [facility].

## 2018-11-28 NOTE — Assessment & Plan Note (Signed)
Spoke with patient during this telehealth visit about his hypertension. Patient expresses that he is currently happy on his medications. His last three blood pressures were as follows:  1. 129/70 P: 85 2. 127/75 P: 87 3. 128/73 P: 85 Patient is tolerating his medications well and is not experiencing side effects. Patient requests change of pharmacy to [facility] [place], [place] [address] Contact [phone] Store [phone] for medication refills, as he is taking care of his wife in [place] at this time.  P: - Continue Olmesartan-amLODIPine-HCTZ 40-5-25 MG   P:

## 2018-12-16 NOTE — Progress Notes (Signed)
Internal Medicine Clinic Attending  I was present for the phone call with Dr. Gilford Rile.  I personally confirmed the key portions of the history documented by Dr. Gilford Rile and I reviewed pertinent patient test results.  The assessment, diagnosis, and plan were formulated together and I agree with the documentation in the resident's note.

## 2019-01-28 ENCOUNTER — Other Ambulatory Visit: Payer: Self-pay | Admitting: *Deleted

## 2019-01-29 MED ORDER — OLMESARTAN-AMLODIPINE-HCTZ 40-5-25 MG PO TABS: 1.0000 | ORAL_TABLET | Freq: Every day | ORAL | 1 refills | Status: AC

## 2019-04-16 DIAGNOSIS — J157 Pneumonia due to Mycoplasma pneumoniae: Secondary | ICD-10-CM | POA: Diagnosis not present

## 2019-04-16 DIAGNOSIS — J9600 Acute respiratory failure, unspecified whether with hypoxia or hypercapnia: Secondary | ICD-10-CM | POA: Diagnosis not present

## 2019-04-16 DIAGNOSIS — J9690 Respiratory failure, unspecified, unspecified whether with hypoxia or hypercapnia: Secondary | ICD-10-CM | POA: Diagnosis not present

## 2019-04-16 DIAGNOSIS — G9341 Metabolic encephalopathy: Secondary | ICD-10-CM | POA: Diagnosis not present

## 2019-04-16 DIAGNOSIS — I480 Paroxysmal atrial fibrillation: Secondary | ICD-10-CM | POA: Diagnosis not present

## 2019-04-16 DIAGNOSIS — R4182 Altered mental status, unspecified: Secondary | ICD-10-CM | POA: Diagnosis not present

## 2019-04-16 DIAGNOSIS — U071 COVID-19: Secondary | ICD-10-CM | POA: Diagnosis not present

## 2019-04-16 DIAGNOSIS — E871 Hypo-osmolality and hyponatremia: Secondary | ICD-10-CM | POA: Diagnosis not present

## 2019-04-16 DIAGNOSIS — J1282 Pneumonia due to coronavirus disease 2019: Secondary | ICD-10-CM | POA: Diagnosis not present

## 2019-04-16 DIAGNOSIS — R0602 Shortness of breath: Secondary | ICD-10-CM | POA: Diagnosis not present

## 2019-04-16 DIAGNOSIS — J189 Pneumonia, unspecified organism: Secondary | ICD-10-CM | POA: Diagnosis not present

## 2019-04-16 DIAGNOSIS — I4891 Unspecified atrial fibrillation: Secondary | ICD-10-CM | POA: Diagnosis not present

## 2019-04-16 DIAGNOSIS — R198 Other specified symptoms and signs involving the digestive system and abdomen: Secondary | ICD-10-CM | POA: Diagnosis not present

## 2019-04-16 DIAGNOSIS — I482 Chronic atrial fibrillation, unspecified: Secondary | ICD-10-CM | POA: Diagnosis not present

## 2019-04-16 DIAGNOSIS — Z743 Need for continuous supervision: Secondary | ICD-10-CM | POA: Diagnosis not present

## 2019-04-16 DIAGNOSIS — I350 Nonrheumatic aortic (valve) stenosis: Secondary | ICD-10-CM | POA: Diagnosis not present

## 2019-04-16 DIAGNOSIS — J80 Acute respiratory distress syndrome: Secondary | ICD-10-CM | POA: Diagnosis not present

## 2019-04-16 DIAGNOSIS — R0689 Other abnormalities of breathing: Secondary | ICD-10-CM | POA: Diagnosis not present

## 2019-04-16 DIAGNOSIS — J9601 Acute respiratory failure with hypoxia: Secondary | ICD-10-CM | POA: Diagnosis not present

## 2019-04-16 DIAGNOSIS — Z4682 Encounter for fitting and adjustment of non-vascular catheter: Secondary | ICD-10-CM | POA: Diagnosis not present

## 2019-04-16 DIAGNOSIS — I5033 Acute on chronic diastolic (congestive) heart failure: Secondary | ICD-10-CM | POA: Diagnosis not present

## 2019-04-16 DIAGNOSIS — A4189 Other specified sepsis: Secondary | ICD-10-CM | POA: Diagnosis not present

## 2019-05-01 DIAGNOSIS — J1282 Pneumonia due to coronavirus disease 2019: Secondary | ICD-10-CM | POA: Diagnosis not present

## 2019-05-01 DIAGNOSIS — I110 Hypertensive heart disease with heart failure: Secondary | ICD-10-CM | POA: Diagnosis not present

## 2019-05-01 DIAGNOSIS — I5030 Unspecified diastolic (congestive) heart failure: Secondary | ICD-10-CM | POA: Diagnosis not present

## 2019-05-01 DIAGNOSIS — U071 COVID-19: Secondary | ICD-10-CM | POA: Diagnosis not present

## 2019-05-01 DIAGNOSIS — I4891 Unspecified atrial fibrillation: Secondary | ICD-10-CM | POA: Diagnosis not present

## 2019-05-01 DIAGNOSIS — E785 Hyperlipidemia, unspecified: Secondary | ICD-10-CM | POA: Diagnosis not present

## 2019-05-01 DIAGNOSIS — D649 Anemia, unspecified: Secondary | ICD-10-CM | POA: Diagnosis not present

## 2019-05-01 DIAGNOSIS — J9601 Acute respiratory failure with hypoxia: Secondary | ICD-10-CM | POA: Diagnosis not present

## 2019-05-01 DIAGNOSIS — I350 Nonrheumatic aortic (valve) stenosis: Secondary | ICD-10-CM | POA: Diagnosis not present

## 2019-05-05 DIAGNOSIS — I4891 Unspecified atrial fibrillation: Secondary | ICD-10-CM | POA: Diagnosis not present

## 2019-05-05 DIAGNOSIS — I110 Hypertensive heart disease with heart failure: Secondary | ICD-10-CM | POA: Diagnosis not present

## 2019-05-05 DIAGNOSIS — U071 COVID-19: Secondary | ICD-10-CM | POA: Diagnosis not present

## 2019-05-05 DIAGNOSIS — J9601 Acute respiratory failure with hypoxia: Secondary | ICD-10-CM | POA: Diagnosis not present

## 2019-05-05 DIAGNOSIS — I350 Nonrheumatic aortic (valve) stenosis: Secondary | ICD-10-CM | POA: Diagnosis not present

## 2019-05-05 DIAGNOSIS — E785 Hyperlipidemia, unspecified: Secondary | ICD-10-CM | POA: Diagnosis not present

## 2019-05-05 DIAGNOSIS — J1282 Pneumonia due to coronavirus disease 2019: Secondary | ICD-10-CM | POA: Diagnosis not present

## 2019-05-05 DIAGNOSIS — D649 Anemia, unspecified: Secondary | ICD-10-CM | POA: Diagnosis not present

## 2019-05-05 DIAGNOSIS — I5030 Unspecified diastolic (congestive) heart failure: Secondary | ICD-10-CM | POA: Diagnosis not present

## 2019-05-06 DIAGNOSIS — B356 Tinea cruris: Secondary | ICD-10-CM | POA: Diagnosis not present

## 2019-05-06 DIAGNOSIS — J9601 Acute respiratory failure with hypoxia: Secondary | ICD-10-CM | POA: Diagnosis not present

## 2019-05-06 DIAGNOSIS — Z7689 Persons encountering health services in other specified circumstances: Secondary | ICD-10-CM | POA: Diagnosis not present

## 2019-05-06 DIAGNOSIS — D649 Anemia, unspecified: Secondary | ICD-10-CM | POA: Diagnosis not present

## 2019-05-06 DIAGNOSIS — E785 Hyperlipidemia, unspecified: Secondary | ICD-10-CM | POA: Diagnosis not present

## 2019-05-06 DIAGNOSIS — J1282 Pneumonia due to coronavirus disease 2019: Secondary | ICD-10-CM | POA: Diagnosis not present

## 2019-05-06 DIAGNOSIS — I5032 Chronic diastolic (congestive) heart failure: Secondary | ICD-10-CM | POA: Diagnosis not present

## 2019-05-06 DIAGNOSIS — I4891 Unspecified atrial fibrillation: Secondary | ICD-10-CM | POA: Diagnosis not present

## 2019-05-06 DIAGNOSIS — U071 COVID-19: Secondary | ICD-10-CM | POA: Diagnosis not present

## 2019-05-06 DIAGNOSIS — I10 Essential (primary) hypertension: Secondary | ICD-10-CM | POA: Diagnosis not present

## 2019-05-06 DIAGNOSIS — I5030 Unspecified diastolic (congestive) heart failure: Secondary | ICD-10-CM | POA: Diagnosis not present

## 2019-05-06 DIAGNOSIS — I110 Hypertensive heart disease with heart failure: Secondary | ICD-10-CM | POA: Diagnosis not present

## 2019-05-06 DIAGNOSIS — I350 Nonrheumatic aortic (valve) stenosis: Secondary | ICD-10-CM | POA: Diagnosis not present

## 2019-05-07 DIAGNOSIS — U071 COVID-19: Secondary | ICD-10-CM | POA: Diagnosis not present

## 2019-05-07 DIAGNOSIS — I5032 Chronic diastolic (congestive) heart failure: Secondary | ICD-10-CM | POA: Diagnosis not present

## 2019-05-08 DIAGNOSIS — I350 Nonrheumatic aortic (valve) stenosis: Secondary | ICD-10-CM | POA: Diagnosis not present

## 2019-05-08 DIAGNOSIS — D649 Anemia, unspecified: Secondary | ICD-10-CM | POA: Diagnosis not present

## 2019-05-08 DIAGNOSIS — U071 COVID-19: Secondary | ICD-10-CM | POA: Diagnosis not present

## 2019-05-08 DIAGNOSIS — I4891 Unspecified atrial fibrillation: Secondary | ICD-10-CM | POA: Diagnosis not present

## 2019-05-08 DIAGNOSIS — E039 Hypothyroidism, unspecified: Secondary | ICD-10-CM | POA: Diagnosis not present

## 2019-05-08 DIAGNOSIS — E785 Hyperlipidemia, unspecified: Secondary | ICD-10-CM | POA: Diagnosis not present

## 2019-05-08 DIAGNOSIS — R0989 Other specified symptoms and signs involving the circulatory and respiratory systems: Secondary | ICD-10-CM | POA: Diagnosis not present

## 2019-05-08 DIAGNOSIS — R7401 Elevation of levels of liver transaminase levels: Secondary | ICD-10-CM | POA: Diagnosis not present

## 2019-05-08 DIAGNOSIS — I10 Essential (primary) hypertension: Secondary | ICD-10-CM | POA: Diagnosis not present

## 2019-05-08 DIAGNOSIS — I5032 Chronic diastolic (congestive) heart failure: Secondary | ICD-10-CM | POA: Diagnosis not present

## 2019-05-09 DIAGNOSIS — I350 Nonrheumatic aortic (valve) stenosis: Secondary | ICD-10-CM | POA: Diagnosis not present

## 2019-05-09 DIAGNOSIS — E785 Hyperlipidemia, unspecified: Secondary | ICD-10-CM | POA: Diagnosis not present

## 2019-05-09 DIAGNOSIS — U071 COVID-19: Secondary | ICD-10-CM | POA: Diagnosis not present

## 2019-05-09 DIAGNOSIS — I4891 Unspecified atrial fibrillation: Secondary | ICD-10-CM | POA: Diagnosis not present

## 2019-05-09 DIAGNOSIS — J1282 Pneumonia due to coronavirus disease 2019: Secondary | ICD-10-CM | POA: Diagnosis not present

## 2019-05-09 DIAGNOSIS — J9601 Acute respiratory failure with hypoxia: Secondary | ICD-10-CM | POA: Diagnosis not present

## 2019-05-09 DIAGNOSIS — I110 Hypertensive heart disease with heart failure: Secondary | ICD-10-CM | POA: Diagnosis not present

## 2019-05-09 DIAGNOSIS — D649 Anemia, unspecified: Secondary | ICD-10-CM | POA: Diagnosis not present

## 2019-05-09 DIAGNOSIS — I5030 Unspecified diastolic (congestive) heart failure: Secondary | ICD-10-CM | POA: Diagnosis not present

## 2019-05-12 DIAGNOSIS — I5032 Chronic diastolic (congestive) heart failure: Secondary | ICD-10-CM | POA: Diagnosis not present

## 2019-05-12 DIAGNOSIS — Z7689 Persons encountering health services in other specified circumstances: Secondary | ICD-10-CM | POA: Diagnosis not present

## 2019-05-12 DIAGNOSIS — I10 Essential (primary) hypertension: Secondary | ICD-10-CM | POA: Diagnosis not present

## 2019-05-12 DIAGNOSIS — I4891 Unspecified atrial fibrillation: Secondary | ICD-10-CM | POA: Diagnosis not present

## 2019-05-13 DIAGNOSIS — I4891 Unspecified atrial fibrillation: Secondary | ICD-10-CM | POA: Diagnosis not present

## 2019-05-13 DIAGNOSIS — B356 Tinea cruris: Secondary | ICD-10-CM | POA: Diagnosis not present

## 2019-05-13 DIAGNOSIS — D649 Anemia, unspecified: Secondary | ICD-10-CM | POA: Diagnosis not present

## 2019-05-13 DIAGNOSIS — U071 COVID-19: Secondary | ICD-10-CM | POA: Diagnosis not present

## 2019-05-13 DIAGNOSIS — I5030 Unspecified diastolic (congestive) heart failure: Secondary | ICD-10-CM | POA: Diagnosis not present

## 2019-05-13 DIAGNOSIS — J9601 Acute respiratory failure with hypoxia: Secondary | ICD-10-CM | POA: Diagnosis not present

## 2019-05-13 DIAGNOSIS — I110 Hypertensive heart disease with heart failure: Secondary | ICD-10-CM | POA: Diagnosis not present

## 2019-05-13 DIAGNOSIS — J1282 Pneumonia due to coronavirus disease 2019: Secondary | ICD-10-CM | POA: Diagnosis not present

## 2019-05-13 DIAGNOSIS — I350 Nonrheumatic aortic (valve) stenosis: Secondary | ICD-10-CM | POA: Diagnosis not present

## 2019-05-13 DIAGNOSIS — E785 Hyperlipidemia, unspecified: Secondary | ICD-10-CM | POA: Diagnosis not present

## 2019-05-13 DIAGNOSIS — I10 Essential (primary) hypertension: Secondary | ICD-10-CM | POA: Diagnosis not present

## 2019-05-13 DIAGNOSIS — Z7189 Other specified counseling: Secondary | ICD-10-CM | POA: Diagnosis not present

## 2019-05-13 DIAGNOSIS — I5032 Chronic diastolic (congestive) heart failure: Secondary | ICD-10-CM | POA: Diagnosis not present

## 2019-05-15 DIAGNOSIS — I110 Hypertensive heart disease with heart failure: Secondary | ICD-10-CM | POA: Diagnosis not present

## 2019-05-15 DIAGNOSIS — U071 COVID-19: Secondary | ICD-10-CM | POA: Diagnosis not present

## 2019-05-15 DIAGNOSIS — I350 Nonrheumatic aortic (valve) stenosis: Secondary | ICD-10-CM | POA: Diagnosis not present

## 2019-05-15 DIAGNOSIS — J1282 Pneumonia due to coronavirus disease 2019: Secondary | ICD-10-CM | POA: Diagnosis not present

## 2019-05-15 DIAGNOSIS — I5030 Unspecified diastolic (congestive) heart failure: Secondary | ICD-10-CM | POA: Diagnosis not present

## 2019-05-15 DIAGNOSIS — J9601 Acute respiratory failure with hypoxia: Secondary | ICD-10-CM | POA: Diagnosis not present

## 2019-05-15 DIAGNOSIS — I10 Essential (primary) hypertension: Secondary | ICD-10-CM | POA: Diagnosis not present

## 2019-05-15 DIAGNOSIS — E785 Hyperlipidemia, unspecified: Secondary | ICD-10-CM | POA: Diagnosis not present

## 2019-05-15 DIAGNOSIS — D649 Anemia, unspecified: Secondary | ICD-10-CM | POA: Diagnosis not present

## 2019-05-15 DIAGNOSIS — I4891 Unspecified atrial fibrillation: Secondary | ICD-10-CM | POA: Diagnosis not present

## 2019-05-15 DIAGNOSIS — I50811 Acute right heart failure: Secondary | ICD-10-CM | POA: Diagnosis not present

## 2019-05-15 DIAGNOSIS — I480 Paroxysmal atrial fibrillation: Secondary | ICD-10-CM | POA: Diagnosis not present

## 2019-05-22 DIAGNOSIS — I110 Hypertensive heart disease with heart failure: Secondary | ICD-10-CM | POA: Diagnosis not present

## 2019-05-22 DIAGNOSIS — D649 Anemia, unspecified: Secondary | ICD-10-CM | POA: Diagnosis not present

## 2019-05-22 DIAGNOSIS — E785 Hyperlipidemia, unspecified: Secondary | ICD-10-CM | POA: Diagnosis not present

## 2019-05-22 DIAGNOSIS — U071 COVID-19: Secondary | ICD-10-CM | POA: Diagnosis not present

## 2019-05-22 DIAGNOSIS — J9601 Acute respiratory failure with hypoxia: Secondary | ICD-10-CM | POA: Diagnosis not present

## 2019-05-22 DIAGNOSIS — I350 Nonrheumatic aortic (valve) stenosis: Secondary | ICD-10-CM | POA: Diagnosis not present

## 2019-05-22 DIAGNOSIS — I4891 Unspecified atrial fibrillation: Secondary | ICD-10-CM | POA: Diagnosis not present

## 2019-05-22 DIAGNOSIS — I5030 Unspecified diastolic (congestive) heart failure: Secondary | ICD-10-CM | POA: Diagnosis not present

## 2019-05-22 DIAGNOSIS — J1282 Pneumonia due to coronavirus disease 2019: Secondary | ICD-10-CM | POA: Diagnosis not present

## 2019-05-23 DIAGNOSIS — U071 COVID-19: Secondary | ICD-10-CM | POA: Diagnosis not present

## 2019-05-23 DIAGNOSIS — I5030 Unspecified diastolic (congestive) heart failure: Secondary | ICD-10-CM | POA: Diagnosis not present

## 2019-05-23 DIAGNOSIS — J1282 Pneumonia due to coronavirus disease 2019: Secondary | ICD-10-CM | POA: Diagnosis not present

## 2019-05-23 DIAGNOSIS — D649 Anemia, unspecified: Secondary | ICD-10-CM | POA: Diagnosis not present

## 2019-05-23 DIAGNOSIS — E785 Hyperlipidemia, unspecified: Secondary | ICD-10-CM | POA: Diagnosis not present

## 2019-05-23 DIAGNOSIS — I4891 Unspecified atrial fibrillation: Secondary | ICD-10-CM | POA: Diagnosis not present

## 2019-05-23 DIAGNOSIS — J9601 Acute respiratory failure with hypoxia: Secondary | ICD-10-CM | POA: Diagnosis not present

## 2019-05-23 DIAGNOSIS — I350 Nonrheumatic aortic (valve) stenosis: Secondary | ICD-10-CM | POA: Diagnosis not present

## 2019-05-23 DIAGNOSIS — I110 Hypertensive heart disease with heart failure: Secondary | ICD-10-CM | POA: Diagnosis not present

## 2019-05-26 DIAGNOSIS — I4891 Unspecified atrial fibrillation: Secondary | ICD-10-CM | POA: Diagnosis not present

## 2019-05-26 DIAGNOSIS — U071 COVID-19: Secondary | ICD-10-CM | POA: Diagnosis not present

## 2019-05-26 DIAGNOSIS — J1282 Pneumonia due to coronavirus disease 2019: Secondary | ICD-10-CM | POA: Diagnosis not present

## 2019-05-27 DIAGNOSIS — I350 Nonrheumatic aortic (valve) stenosis: Secondary | ICD-10-CM | POA: Diagnosis not present

## 2019-05-27 DIAGNOSIS — M6281 Muscle weakness (generalized): Secondary | ICD-10-CM | POA: Diagnosis not present

## 2019-05-27 DIAGNOSIS — I4891 Unspecified atrial fibrillation: Secondary | ICD-10-CM | POA: Diagnosis not present

## 2019-05-27 DIAGNOSIS — B356 Tinea cruris: Secondary | ICD-10-CM | POA: Diagnosis not present

## 2019-05-27 DIAGNOSIS — I10 Essential (primary) hypertension: Secondary | ICD-10-CM | POA: Diagnosis not present

## 2019-05-27 DIAGNOSIS — I5032 Chronic diastolic (congestive) heart failure: Secondary | ICD-10-CM | POA: Diagnosis not present

## 2019-05-27 DIAGNOSIS — E039 Hypothyroidism, unspecified: Secondary | ICD-10-CM | POA: Diagnosis not present

## 2019-05-27 DIAGNOSIS — U071 COVID-19: Secondary | ICD-10-CM | POA: Diagnosis not present

## 2019-05-29 DIAGNOSIS — I350 Nonrheumatic aortic (valve) stenosis: Secondary | ICD-10-CM | POA: Diagnosis not present

## 2019-05-29 DIAGNOSIS — J9601 Acute respiratory failure with hypoxia: Secondary | ICD-10-CM | POA: Diagnosis not present

## 2019-05-29 DIAGNOSIS — U071 COVID-19: Secondary | ICD-10-CM | POA: Diagnosis not present

## 2019-05-29 DIAGNOSIS — I110 Hypertensive heart disease with heart failure: Secondary | ICD-10-CM | POA: Diagnosis not present

## 2019-05-29 DIAGNOSIS — E785 Hyperlipidemia, unspecified: Secondary | ICD-10-CM | POA: Diagnosis not present

## 2019-05-29 DIAGNOSIS — D649 Anemia, unspecified: Secondary | ICD-10-CM | POA: Diagnosis not present

## 2019-05-29 DIAGNOSIS — I5030 Unspecified diastolic (congestive) heart failure: Secondary | ICD-10-CM | POA: Diagnosis not present

## 2019-05-29 DIAGNOSIS — I4891 Unspecified atrial fibrillation: Secondary | ICD-10-CM | POA: Diagnosis not present

## 2019-05-29 DIAGNOSIS — J1282 Pneumonia due to coronavirus disease 2019: Secondary | ICD-10-CM | POA: Diagnosis not present

## 2019-06-04 DIAGNOSIS — I4891 Unspecified atrial fibrillation: Secondary | ICD-10-CM | POA: Diagnosis not present

## 2019-06-04 DIAGNOSIS — I5030 Unspecified diastolic (congestive) heart failure: Secondary | ICD-10-CM | POA: Diagnosis not present

## 2019-06-04 DIAGNOSIS — J1282 Pneumonia due to coronavirus disease 2019: Secondary | ICD-10-CM | POA: Diagnosis not present

## 2019-06-04 DIAGNOSIS — D649 Anemia, unspecified: Secondary | ICD-10-CM | POA: Diagnosis not present

## 2019-06-04 DIAGNOSIS — E785 Hyperlipidemia, unspecified: Secondary | ICD-10-CM | POA: Diagnosis not present

## 2019-06-04 DIAGNOSIS — J9601 Acute respiratory failure with hypoxia: Secondary | ICD-10-CM | POA: Diagnosis not present

## 2019-06-04 DIAGNOSIS — I110 Hypertensive heart disease with heart failure: Secondary | ICD-10-CM | POA: Diagnosis not present

## 2019-06-04 DIAGNOSIS — I350 Nonrheumatic aortic (valve) stenosis: Secondary | ICD-10-CM | POA: Diagnosis not present

## 2019-06-04 DIAGNOSIS — U071 COVID-19: Secondary | ICD-10-CM | POA: Diagnosis not present

## 2019-06-05 DIAGNOSIS — Z01812 Encounter for preprocedural laboratory examination: Secondary | ICD-10-CM | POA: Diagnosis not present

## 2019-06-05 DIAGNOSIS — I350 Nonrheumatic aortic (valve) stenosis: Secondary | ICD-10-CM | POA: Diagnosis not present

## 2019-06-05 DIAGNOSIS — Z01818 Encounter for other preprocedural examination: Secondary | ICD-10-CM | POA: Diagnosis not present

## 2019-06-05 DIAGNOSIS — I509 Heart failure, unspecified: Secondary | ICD-10-CM | POA: Diagnosis not present

## 2019-06-05 DIAGNOSIS — J90 Pleural effusion, not elsewhere classified: Secondary | ICD-10-CM | POA: Diagnosis not present

## 2019-06-11 DIAGNOSIS — Z87891 Personal history of nicotine dependence: Secondary | ICD-10-CM | POA: Diagnosis not present

## 2019-06-11 DIAGNOSIS — I50811 Acute right heart failure: Secondary | ICD-10-CM | POA: Diagnosis not present

## 2019-06-11 DIAGNOSIS — I110 Hypertensive heart disease with heart failure: Secondary | ICD-10-CM | POA: Diagnosis not present

## 2019-06-11 DIAGNOSIS — I2510 Atherosclerotic heart disease of native coronary artery without angina pectoris: Secondary | ICD-10-CM | POA: Diagnosis not present

## 2019-06-11 DIAGNOSIS — I5032 Chronic diastolic (congestive) heart failure: Secondary | ICD-10-CM | POA: Diagnosis not present

## 2019-06-11 DIAGNOSIS — I480 Paroxysmal atrial fibrillation: Secondary | ICD-10-CM | POA: Diagnosis not present

## 2019-06-11 DIAGNOSIS — U071 COVID-19: Secondary | ICD-10-CM | POA: Diagnosis not present

## 2019-06-11 DIAGNOSIS — I4891 Unspecified atrial fibrillation: Secondary | ICD-10-CM | POA: Diagnosis not present

## 2019-06-11 DIAGNOSIS — I350 Nonrheumatic aortic (valve) stenosis: Secondary | ICD-10-CM | POA: Diagnosis not present

## 2019-06-11 DIAGNOSIS — E785 Hyperlipidemia, unspecified: Secondary | ICD-10-CM | POA: Diagnosis not present

## 2019-06-13 DIAGNOSIS — B356 Tinea cruris: Secondary | ICD-10-CM | POA: Diagnosis not present

## 2019-06-13 DIAGNOSIS — I5032 Chronic diastolic (congestive) heart failure: Secondary | ICD-10-CM | POA: Diagnosis not present

## 2019-06-13 DIAGNOSIS — I350 Nonrheumatic aortic (valve) stenosis: Secondary | ICD-10-CM | POA: Diagnosis not present

## 2019-06-13 DIAGNOSIS — I10 Essential (primary) hypertension: Secondary | ICD-10-CM | POA: Diagnosis not present

## 2019-06-13 DIAGNOSIS — M6281 Muscle weakness (generalized): Secondary | ICD-10-CM | POA: Diagnosis not present

## 2019-06-13 DIAGNOSIS — Z7689 Persons encountering health services in other specified circumstances: Secondary | ICD-10-CM | POA: Diagnosis not present

## 2019-06-13 DIAGNOSIS — I4891 Unspecified atrial fibrillation: Secondary | ICD-10-CM | POA: Diagnosis not present

## 2019-06-13 DIAGNOSIS — E039 Hypothyroidism, unspecified: Secondary | ICD-10-CM | POA: Diagnosis not present

## 2019-06-17 DIAGNOSIS — I480 Paroxysmal atrial fibrillation: Secondary | ICD-10-CM | POA: Diagnosis not present

## 2019-06-17 DIAGNOSIS — I5030 Unspecified diastolic (congestive) heart failure: Secondary | ICD-10-CM | POA: Diagnosis not present

## 2019-06-17 DIAGNOSIS — I350 Nonrheumatic aortic (valve) stenosis: Secondary | ICD-10-CM | POA: Diagnosis not present

## 2019-06-17 DIAGNOSIS — I110 Hypertensive heart disease with heart failure: Secondary | ICD-10-CM | POA: Diagnosis not present

## 2019-06-19 DIAGNOSIS — I350 Nonrheumatic aortic (valve) stenosis: Secondary | ICD-10-CM | POA: Diagnosis not present

## 2019-06-19 DIAGNOSIS — I4819 Other persistent atrial fibrillation: Secondary | ICD-10-CM | POA: Diagnosis not present

## 2019-06-19 DIAGNOSIS — I10 Essential (primary) hypertension: Secondary | ICD-10-CM | POA: Diagnosis not present

## 2019-06-25 DIAGNOSIS — J1282 Pneumonia due to coronavirus disease 2019: Secondary | ICD-10-CM | POA: Diagnosis not present

## 2019-06-25 DIAGNOSIS — I110 Hypertensive heart disease with heart failure: Secondary | ICD-10-CM | POA: Diagnosis not present

## 2019-06-25 DIAGNOSIS — J9601 Acute respiratory failure with hypoxia: Secondary | ICD-10-CM | POA: Diagnosis not present

## 2019-06-25 DIAGNOSIS — I350 Nonrheumatic aortic (valve) stenosis: Secondary | ICD-10-CM | POA: Diagnosis not present

## 2019-06-25 DIAGNOSIS — E785 Hyperlipidemia, unspecified: Secondary | ICD-10-CM | POA: Diagnosis not present

## 2019-06-25 DIAGNOSIS — I4891 Unspecified atrial fibrillation: Secondary | ICD-10-CM | POA: Diagnosis not present

## 2019-06-25 DIAGNOSIS — D649 Anemia, unspecified: Secondary | ICD-10-CM | POA: Diagnosis not present

## 2019-06-25 DIAGNOSIS — I5030 Unspecified diastolic (congestive) heart failure: Secondary | ICD-10-CM | POA: Diagnosis not present

## 2019-06-25 DIAGNOSIS — U071 COVID-19: Secondary | ICD-10-CM | POA: Diagnosis not present

## 2019-06-27 DIAGNOSIS — I4891 Unspecified atrial fibrillation: Secondary | ICD-10-CM | POA: Diagnosis not present

## 2019-06-27 DIAGNOSIS — I5032 Chronic diastolic (congestive) heart failure: Secondary | ICD-10-CM | POA: Diagnosis not present

## 2019-06-27 DIAGNOSIS — E039 Hypothyroidism, unspecified: Secondary | ICD-10-CM | POA: Diagnosis not present

## 2019-06-27 DIAGNOSIS — M6281 Muscle weakness (generalized): Secondary | ICD-10-CM | POA: Diagnosis not present

## 2019-06-27 DIAGNOSIS — E785 Hyperlipidemia, unspecified: Secondary | ICD-10-CM | POA: Diagnosis not present

## 2019-06-27 DIAGNOSIS — I350 Nonrheumatic aortic (valve) stenosis: Secondary | ICD-10-CM | POA: Diagnosis not present

## 2019-06-27 DIAGNOSIS — I10 Essential (primary) hypertension: Secondary | ICD-10-CM | POA: Diagnosis not present

## 2019-06-27 DIAGNOSIS — Z7189 Other specified counseling: Secondary | ICD-10-CM | POA: Diagnosis not present

## 2019-06-30 DIAGNOSIS — I350 Nonrheumatic aortic (valve) stenosis: Secondary | ICD-10-CM | POA: Diagnosis not present

## 2019-06-30 DIAGNOSIS — Z20822 Contact with and (suspected) exposure to covid-19: Secondary | ICD-10-CM | POA: Diagnosis not present

## 2019-06-30 DIAGNOSIS — R0602 Shortness of breath: Secondary | ICD-10-CM | POA: Diagnosis not present

## 2019-06-30 DIAGNOSIS — Z006 Encounter for examination for normal comparison and control in clinical research program: Secondary | ICD-10-CM | POA: Diagnosis not present

## 2019-06-30 DIAGNOSIS — I482 Chronic atrial fibrillation, unspecified: Secondary | ICD-10-CM | POA: Diagnosis not present

## 2019-06-30 DIAGNOSIS — I5033 Acute on chronic diastolic (congestive) heart failure: Secondary | ICD-10-CM | POA: Diagnosis not present

## 2019-06-30 DIAGNOSIS — I480 Paroxysmal atrial fibrillation: Secondary | ICD-10-CM | POA: Diagnosis not present

## 2019-07-04 DIAGNOSIS — I350 Nonrheumatic aortic (valve) stenosis: Secondary | ICD-10-CM | POA: Diagnosis not present

## 2019-07-04 DIAGNOSIS — Z7901 Long term (current) use of anticoagulants: Secondary | ICD-10-CM | POA: Diagnosis not present

## 2019-07-04 DIAGNOSIS — D649 Anemia, unspecified: Secondary | ICD-10-CM | POA: Diagnosis not present

## 2019-07-04 DIAGNOSIS — I5030 Unspecified diastolic (congestive) heart failure: Secondary | ICD-10-CM | POA: Diagnosis not present

## 2019-07-04 DIAGNOSIS — Z7982 Long term (current) use of aspirin: Secondary | ICD-10-CM | POA: Diagnosis not present

## 2019-07-04 DIAGNOSIS — E785 Hyperlipidemia, unspecified: Secondary | ICD-10-CM | POA: Diagnosis not present

## 2019-07-04 DIAGNOSIS — Z8616 Personal history of COVID-19: Secondary | ICD-10-CM | POA: Diagnosis not present

## 2019-07-04 DIAGNOSIS — I110 Hypertensive heart disease with heart failure: Secondary | ICD-10-CM | POA: Diagnosis not present

## 2019-07-04 DIAGNOSIS — I4891 Unspecified atrial fibrillation: Secondary | ICD-10-CM | POA: Diagnosis not present

## 2019-07-05 DIAGNOSIS — E039 Hypothyroidism, unspecified: Secondary | ICD-10-CM | POA: Diagnosis not present

## 2019-07-05 DIAGNOSIS — I10 Essential (primary) hypertension: Secondary | ICD-10-CM | POA: Diagnosis not present

## 2019-07-05 DIAGNOSIS — E785 Hyperlipidemia, unspecified: Secondary | ICD-10-CM | POA: Diagnosis not present

## 2019-07-05 DIAGNOSIS — M6281 Muscle weakness (generalized): Secondary | ICD-10-CM | POA: Diagnosis not present

## 2019-07-05 DIAGNOSIS — I5032 Chronic diastolic (congestive) heart failure: Secondary | ICD-10-CM | POA: Diagnosis not present

## 2019-07-05 DIAGNOSIS — I4891 Unspecified atrial fibrillation: Secondary | ICD-10-CM | POA: Diagnosis not present

## 2019-07-05 DIAGNOSIS — Z7189 Other specified counseling: Secondary | ICD-10-CM | POA: Diagnosis not present

## 2019-07-05 DIAGNOSIS — I350 Nonrheumatic aortic (valve) stenosis: Secondary | ICD-10-CM | POA: Diagnosis not present

## 2019-07-11 DIAGNOSIS — I5030 Unspecified diastolic (congestive) heart failure: Secondary | ICD-10-CM | POA: Diagnosis not present

## 2019-07-11 DIAGNOSIS — I350 Nonrheumatic aortic (valve) stenosis: Secondary | ICD-10-CM | POA: Diagnosis not present

## 2019-07-11 DIAGNOSIS — D649 Anemia, unspecified: Secondary | ICD-10-CM | POA: Diagnosis not present

## 2019-07-11 DIAGNOSIS — I110 Hypertensive heart disease with heart failure: Secondary | ICD-10-CM | POA: Diagnosis not present

## 2019-07-11 DIAGNOSIS — Z8616 Personal history of COVID-19: Secondary | ICD-10-CM | POA: Diagnosis not present

## 2019-07-11 DIAGNOSIS — E785 Hyperlipidemia, unspecified: Secondary | ICD-10-CM | POA: Diagnosis not present

## 2019-07-11 DIAGNOSIS — Z7901 Long term (current) use of anticoagulants: Secondary | ICD-10-CM | POA: Diagnosis not present

## 2019-07-11 DIAGNOSIS — I4891 Unspecified atrial fibrillation: Secondary | ICD-10-CM | POA: Diagnosis not present

## 2019-07-11 DIAGNOSIS — Z7982 Long term (current) use of aspirin: Secondary | ICD-10-CM | POA: Diagnosis not present

## 2019-07-12 DIAGNOSIS — M6281 Muscle weakness (generalized): Secondary | ICD-10-CM | POA: Diagnosis not present

## 2019-07-12 DIAGNOSIS — I4891 Unspecified atrial fibrillation: Secondary | ICD-10-CM | POA: Diagnosis not present

## 2019-07-12 DIAGNOSIS — I10 Essential (primary) hypertension: Secondary | ICD-10-CM | POA: Diagnosis not present

## 2019-07-12 DIAGNOSIS — E785 Hyperlipidemia, unspecified: Secondary | ICD-10-CM | POA: Diagnosis not present

## 2019-07-12 DIAGNOSIS — I350 Nonrheumatic aortic (valve) stenosis: Secondary | ICD-10-CM | POA: Diagnosis not present

## 2019-07-12 DIAGNOSIS — E039 Hypothyroidism, unspecified: Secondary | ICD-10-CM | POA: Diagnosis not present

## 2019-07-12 DIAGNOSIS — I5032 Chronic diastolic (congestive) heart failure: Secondary | ICD-10-CM | POA: Diagnosis not present

## 2019-07-14 DIAGNOSIS — I110 Hypertensive heart disease with heart failure: Secondary | ICD-10-CM | POA: Diagnosis not present

## 2019-07-18 DIAGNOSIS — Z7901 Long term (current) use of anticoagulants: Secondary | ICD-10-CM | POA: Diagnosis not present

## 2019-07-18 DIAGNOSIS — D649 Anemia, unspecified: Secondary | ICD-10-CM | POA: Diagnosis not present

## 2019-07-18 DIAGNOSIS — I5030 Unspecified diastolic (congestive) heart failure: Secondary | ICD-10-CM | POA: Diagnosis not present

## 2019-07-18 DIAGNOSIS — I110 Hypertensive heart disease with heart failure: Secondary | ICD-10-CM | POA: Diagnosis not present

## 2019-07-18 DIAGNOSIS — Z8616 Personal history of COVID-19: Secondary | ICD-10-CM | POA: Diagnosis not present

## 2019-07-18 DIAGNOSIS — I4891 Unspecified atrial fibrillation: Secondary | ICD-10-CM | POA: Diagnosis not present

## 2019-07-18 DIAGNOSIS — Z7982 Long term (current) use of aspirin: Secondary | ICD-10-CM | POA: Diagnosis not present

## 2019-07-18 DIAGNOSIS — I350 Nonrheumatic aortic (valve) stenosis: Secondary | ICD-10-CM | POA: Diagnosis not present

## 2019-07-18 DIAGNOSIS — E785 Hyperlipidemia, unspecified: Secondary | ICD-10-CM | POA: Diagnosis not present

## 2019-07-24 DIAGNOSIS — I4819 Other persistent atrial fibrillation: Secondary | ICD-10-CM | POA: Diagnosis not present

## 2019-07-24 DIAGNOSIS — I2510 Atherosclerotic heart disease of native coronary artery without angina pectoris: Secondary | ICD-10-CM | POA: Diagnosis not present

## 2019-07-24 DIAGNOSIS — I10 Essential (primary) hypertension: Secondary | ICD-10-CM | POA: Diagnosis not present

## 2019-07-24 DIAGNOSIS — Z953 Presence of xenogenic heart valve: Secondary | ICD-10-CM | POA: Diagnosis not present

## 2019-07-25 DIAGNOSIS — Z7689 Persons encountering health services in other specified circumstances: Secondary | ICD-10-CM | POA: Diagnosis not present

## 2019-07-25 DIAGNOSIS — I350 Nonrheumatic aortic (valve) stenosis: Secondary | ICD-10-CM | POA: Diagnosis not present

## 2019-07-25 DIAGNOSIS — I5032 Chronic diastolic (congestive) heart failure: Secondary | ICD-10-CM | POA: Diagnosis not present

## 2019-07-25 DIAGNOSIS — I4891 Unspecified atrial fibrillation: Secondary | ICD-10-CM | POA: Diagnosis not present

## 2019-07-25 DIAGNOSIS — E039 Hypothyroidism, unspecified: Secondary | ICD-10-CM | POA: Diagnosis not present

## 2019-07-25 DIAGNOSIS — I10 Essential (primary) hypertension: Secondary | ICD-10-CM | POA: Diagnosis not present

## 2019-07-25 DIAGNOSIS — E785 Hyperlipidemia, unspecified: Secondary | ICD-10-CM | POA: Diagnosis not present

## 2019-07-25 DIAGNOSIS — M6281 Muscle weakness (generalized): Secondary | ICD-10-CM | POA: Diagnosis not present

## 2019-07-29 DIAGNOSIS — D649 Anemia, unspecified: Secondary | ICD-10-CM | POA: Diagnosis not present

## 2019-07-29 DIAGNOSIS — I080 Rheumatic disorders of both mitral and aortic valves: Secondary | ICD-10-CM | POA: Diagnosis not present

## 2019-07-29 DIAGNOSIS — I110 Hypertensive heart disease with heart failure: Secondary | ICD-10-CM | POA: Diagnosis not present

## 2019-07-29 DIAGNOSIS — E039 Hypothyroidism, unspecified: Secondary | ICD-10-CM | POA: Diagnosis not present

## 2019-07-29 DIAGNOSIS — I4891 Unspecified atrial fibrillation: Secondary | ICD-10-CM | POA: Diagnosis not present

## 2019-07-29 DIAGNOSIS — Z7901 Long term (current) use of anticoagulants: Secondary | ICD-10-CM | POA: Diagnosis not present

## 2019-07-29 DIAGNOSIS — I5030 Unspecified diastolic (congestive) heart failure: Secondary | ICD-10-CM | POA: Diagnosis not present

## 2019-07-29 DIAGNOSIS — Z952 Presence of prosthetic heart valve: Secondary | ICD-10-CM | POA: Diagnosis not present

## 2019-07-29 DIAGNOSIS — E785 Hyperlipidemia, unspecified: Secondary | ICD-10-CM | POA: Diagnosis not present

## 2019-07-29 DIAGNOSIS — I350 Nonrheumatic aortic (valve) stenosis: Secondary | ICD-10-CM | POA: Diagnosis not present

## 2019-07-29 DIAGNOSIS — Z7982 Long term (current) use of aspirin: Secondary | ICD-10-CM | POA: Diagnosis not present

## 2019-07-29 DIAGNOSIS — Z8616 Personal history of COVID-19: Secondary | ICD-10-CM | POA: Diagnosis not present

## 2019-07-29 DIAGNOSIS — I10 Essential (primary) hypertension: Secondary | ICD-10-CM | POA: Diagnosis not present

## 2019-07-30 DIAGNOSIS — Z953 Presence of xenogenic heart valve: Secondary | ICD-10-CM | POA: Diagnosis not present

## 2019-07-30 DIAGNOSIS — I350 Nonrheumatic aortic (valve) stenosis: Secondary | ICD-10-CM | POA: Diagnosis not present

## 2019-07-31 DIAGNOSIS — E039 Hypothyroidism, unspecified: Secondary | ICD-10-CM | POA: Diagnosis not present

## 2019-07-31 DIAGNOSIS — I4891 Unspecified atrial fibrillation: Secondary | ICD-10-CM | POA: Diagnosis not present

## 2019-07-31 DIAGNOSIS — I10 Essential (primary) hypertension: Secondary | ICD-10-CM | POA: Diagnosis not present

## 2019-07-31 DIAGNOSIS — E785 Hyperlipidemia, unspecified: Secondary | ICD-10-CM | POA: Diagnosis not present

## 2019-07-31 DIAGNOSIS — M6281 Muscle weakness (generalized): Secondary | ICD-10-CM | POA: Diagnosis not present

## 2019-07-31 DIAGNOSIS — Z7189 Other specified counseling: Secondary | ICD-10-CM | POA: Diagnosis not present

## 2019-07-31 DIAGNOSIS — I350 Nonrheumatic aortic (valve) stenosis: Secondary | ICD-10-CM | POA: Diagnosis not present

## 2019-07-31 DIAGNOSIS — I5032 Chronic diastolic (congestive) heart failure: Secondary | ICD-10-CM | POA: Diagnosis not present

## 2019-07-31 DIAGNOSIS — Z953 Presence of xenogenic heart valve: Secondary | ICD-10-CM | POA: Diagnosis not present

## 2019-08-03 DIAGNOSIS — I5030 Unspecified diastolic (congestive) heart failure: Secondary | ICD-10-CM | POA: Diagnosis not present

## 2019-08-03 DIAGNOSIS — I350 Nonrheumatic aortic (valve) stenosis: Secondary | ICD-10-CM | POA: Diagnosis not present

## 2019-08-03 DIAGNOSIS — I110 Hypertensive heart disease with heart failure: Secondary | ICD-10-CM | POA: Diagnosis not present

## 2019-08-03 DIAGNOSIS — Z7982 Long term (current) use of aspirin: Secondary | ICD-10-CM | POA: Diagnosis not present

## 2019-08-03 DIAGNOSIS — Z954 Presence of other heart-valve replacement: Secondary | ICD-10-CM | POA: Diagnosis not present

## 2019-08-03 DIAGNOSIS — D649 Anemia, unspecified: Secondary | ICD-10-CM | POA: Diagnosis not present

## 2019-08-03 DIAGNOSIS — I5032 Chronic diastolic (congestive) heart failure: Secondary | ICD-10-CM | POA: Diagnosis not present

## 2019-08-03 DIAGNOSIS — E785 Hyperlipidemia, unspecified: Secondary | ICD-10-CM | POA: Diagnosis not present

## 2019-08-03 DIAGNOSIS — I4891 Unspecified atrial fibrillation: Secondary | ICD-10-CM | POA: Diagnosis not present

## 2019-08-03 DIAGNOSIS — Z8616 Personal history of COVID-19: Secondary | ICD-10-CM | POA: Diagnosis not present

## 2019-08-03 DIAGNOSIS — I10 Essential (primary) hypertension: Secondary | ICD-10-CM | POA: Diagnosis not present

## 2019-08-03 DIAGNOSIS — Z7901 Long term (current) use of anticoagulants: Secondary | ICD-10-CM | POA: Diagnosis not present

## 2019-08-05 DIAGNOSIS — I350 Nonrheumatic aortic (valve) stenosis: Secondary | ICD-10-CM | POA: Diagnosis not present

## 2019-08-05 DIAGNOSIS — I4891 Unspecified atrial fibrillation: Secondary | ICD-10-CM | POA: Diagnosis not present

## 2019-08-05 DIAGNOSIS — Z953 Presence of xenogenic heart valve: Secondary | ICD-10-CM | POA: Diagnosis not present

## 2019-08-05 DIAGNOSIS — U071 COVID-19: Secondary | ICD-10-CM | POA: Diagnosis not present

## 2019-08-05 DIAGNOSIS — J1282 Pneumonia due to coronavirus disease 2019: Secondary | ICD-10-CM | POA: Diagnosis not present

## 2019-08-05 DIAGNOSIS — I10 Essential (primary) hypertension: Secondary | ICD-10-CM | POA: Diagnosis not present

## 2019-08-05 DIAGNOSIS — I5030 Unspecified diastolic (congestive) heart failure: Secondary | ICD-10-CM | POA: Diagnosis not present

## 2019-08-07 DIAGNOSIS — Z953 Presence of xenogenic heart valve: Secondary | ICD-10-CM | POA: Diagnosis not present

## 2019-08-07 DIAGNOSIS — I350 Nonrheumatic aortic (valve) stenosis: Secondary | ICD-10-CM | POA: Diagnosis not present

## 2019-08-08 DIAGNOSIS — E039 Hypothyroidism, unspecified: Secondary | ICD-10-CM | POA: Diagnosis not present

## 2019-08-08 DIAGNOSIS — Z7189 Other specified counseling: Secondary | ICD-10-CM | POA: Diagnosis not present

## 2019-08-08 DIAGNOSIS — E785 Hyperlipidemia, unspecified: Secondary | ICD-10-CM | POA: Diagnosis not present

## 2019-08-08 DIAGNOSIS — I5032 Chronic diastolic (congestive) heart failure: Secondary | ICD-10-CM | POA: Diagnosis not present

## 2019-08-08 DIAGNOSIS — M6281 Muscle weakness (generalized): Secondary | ICD-10-CM | POA: Diagnosis not present

## 2019-08-08 DIAGNOSIS — I10 Essential (primary) hypertension: Secondary | ICD-10-CM | POA: Diagnosis not present

## 2019-08-08 DIAGNOSIS — I350 Nonrheumatic aortic (valve) stenosis: Secondary | ICD-10-CM | POA: Diagnosis not present

## 2019-08-08 DIAGNOSIS — I4891 Unspecified atrial fibrillation: Secondary | ICD-10-CM | POA: Diagnosis not present

## 2019-08-12 DIAGNOSIS — I2510 Atherosclerotic heart disease of native coronary artery without angina pectoris: Secondary | ICD-10-CM | POA: Diagnosis not present

## 2019-08-12 DIAGNOSIS — I350 Nonrheumatic aortic (valve) stenosis: Secondary | ICD-10-CM | POA: Diagnosis not present

## 2019-08-12 DIAGNOSIS — I10 Essential (primary) hypertension: Secondary | ICD-10-CM | POA: Diagnosis not present

## 2019-08-12 DIAGNOSIS — Z953 Presence of xenogenic heart valve: Secondary | ICD-10-CM | POA: Diagnosis not present

## 2019-08-12 DIAGNOSIS — I4819 Other persistent atrial fibrillation: Secondary | ICD-10-CM | POA: Diagnosis not present

## 2019-08-14 DIAGNOSIS — Z953 Presence of xenogenic heart valve: Secondary | ICD-10-CM | POA: Diagnosis not present

## 2019-08-14 DIAGNOSIS — I350 Nonrheumatic aortic (valve) stenosis: Secondary | ICD-10-CM | POA: Diagnosis not present

## 2019-08-18 DIAGNOSIS — Z953 Presence of xenogenic heart valve: Secondary | ICD-10-CM | POA: Diagnosis not present

## 2019-08-18 DIAGNOSIS — I350 Nonrheumatic aortic (valve) stenosis: Secondary | ICD-10-CM | POA: Diagnosis not present

## 2019-08-19 DIAGNOSIS — I350 Nonrheumatic aortic (valve) stenosis: Secondary | ICD-10-CM | POA: Diagnosis not present

## 2019-08-19 DIAGNOSIS — Z953 Presence of xenogenic heart valve: Secondary | ICD-10-CM | POA: Diagnosis not present

## 2019-08-21 DIAGNOSIS — Z953 Presence of xenogenic heart valve: Secondary | ICD-10-CM | POA: Diagnosis not present

## 2019-08-21 DIAGNOSIS — I350 Nonrheumatic aortic (valve) stenosis: Secondary | ICD-10-CM | POA: Diagnosis not present

## 2019-08-22 DIAGNOSIS — I4891 Unspecified atrial fibrillation: Secondary | ICD-10-CM | POA: Diagnosis not present

## 2019-08-22 DIAGNOSIS — R001 Bradycardia, unspecified: Secondary | ICD-10-CM | POA: Diagnosis not present

## 2019-08-22 DIAGNOSIS — I350 Nonrheumatic aortic (valve) stenosis: Secondary | ICD-10-CM | POA: Diagnosis not present

## 2019-08-22 DIAGNOSIS — E785 Hyperlipidemia, unspecified: Secondary | ICD-10-CM | POA: Diagnosis not present

## 2019-08-22 DIAGNOSIS — I10 Essential (primary) hypertension: Secondary | ICD-10-CM | POA: Diagnosis not present

## 2019-08-22 DIAGNOSIS — I5032 Chronic diastolic (congestive) heart failure: Secondary | ICD-10-CM | POA: Diagnosis not present

## 2019-08-22 DIAGNOSIS — M6281 Muscle weakness (generalized): Secondary | ICD-10-CM | POA: Diagnosis not present

## 2019-08-22 DIAGNOSIS — E039 Hypothyroidism, unspecified: Secondary | ICD-10-CM | POA: Diagnosis not present

## 2019-08-25 DIAGNOSIS — I350 Nonrheumatic aortic (valve) stenosis: Secondary | ICD-10-CM | POA: Diagnosis not present

## 2019-08-25 DIAGNOSIS — Z953 Presence of xenogenic heart valve: Secondary | ICD-10-CM | POA: Diagnosis not present

## 2019-08-26 DIAGNOSIS — I350 Nonrheumatic aortic (valve) stenosis: Secondary | ICD-10-CM | POA: Diagnosis not present

## 2019-08-26 DIAGNOSIS — Z953 Presence of xenogenic heart valve: Secondary | ICD-10-CM | POA: Diagnosis not present

## 2019-08-27 DIAGNOSIS — I350 Nonrheumatic aortic (valve) stenosis: Secondary | ICD-10-CM | POA: Diagnosis not present

## 2019-08-27 DIAGNOSIS — Z953 Presence of xenogenic heart valve: Secondary | ICD-10-CM | POA: Diagnosis not present

## 2019-08-28 DIAGNOSIS — I350 Nonrheumatic aortic (valve) stenosis: Secondary | ICD-10-CM | POA: Diagnosis not present

## 2019-08-28 DIAGNOSIS — I5032 Chronic diastolic (congestive) heart failure: Secondary | ICD-10-CM | POA: Diagnosis not present

## 2019-08-28 DIAGNOSIS — I10 Essential (primary) hypertension: Secondary | ICD-10-CM | POA: Diagnosis not present

## 2019-08-28 DIAGNOSIS — M6281 Muscle weakness (generalized): Secondary | ICD-10-CM | POA: Diagnosis not present

## 2019-08-28 DIAGNOSIS — R001 Bradycardia, unspecified: Secondary | ICD-10-CM | POA: Diagnosis not present

## 2019-08-28 DIAGNOSIS — E039 Hypothyroidism, unspecified: Secondary | ICD-10-CM | POA: Diagnosis not present

## 2019-09-01 DIAGNOSIS — Z953 Presence of xenogenic heart valve: Secondary | ICD-10-CM | POA: Diagnosis not present

## 2019-09-01 DIAGNOSIS — I350 Nonrheumatic aortic (valve) stenosis: Secondary | ICD-10-CM | POA: Diagnosis not present

## 2019-09-02 DIAGNOSIS — I350 Nonrheumatic aortic (valve) stenosis: Secondary | ICD-10-CM | POA: Diagnosis not present

## 2019-09-02 DIAGNOSIS — Z953 Presence of xenogenic heart valve: Secondary | ICD-10-CM | POA: Diagnosis not present

## 2019-09-04 DIAGNOSIS — Z953 Presence of xenogenic heart valve: Secondary | ICD-10-CM | POA: Diagnosis not present

## 2019-09-04 DIAGNOSIS — I350 Nonrheumatic aortic (valve) stenosis: Secondary | ICD-10-CM | POA: Diagnosis not present

## 2019-09-05 DIAGNOSIS — E785 Hyperlipidemia, unspecified: Secondary | ICD-10-CM | POA: Diagnosis not present

## 2019-09-05 DIAGNOSIS — I350 Nonrheumatic aortic (valve) stenosis: Secondary | ICD-10-CM | POA: Diagnosis not present

## 2019-09-05 DIAGNOSIS — I5032 Chronic diastolic (congestive) heart failure: Secondary | ICD-10-CM | POA: Diagnosis not present

## 2019-09-05 DIAGNOSIS — M6281 Muscle weakness (generalized): Secondary | ICD-10-CM | POA: Diagnosis not present

## 2019-09-05 DIAGNOSIS — I10 Essential (primary) hypertension: Secondary | ICD-10-CM | POA: Diagnosis not present

## 2019-09-05 DIAGNOSIS — E039 Hypothyroidism, unspecified: Secondary | ICD-10-CM | POA: Diagnosis not present

## 2019-09-05 DIAGNOSIS — Z7689 Persons encountering health services in other specified circumstances: Secondary | ICD-10-CM | POA: Diagnosis not present

## 2019-09-05 DIAGNOSIS — I4891 Unspecified atrial fibrillation: Secondary | ICD-10-CM | POA: Diagnosis not present

## 2019-09-05 DIAGNOSIS — R001 Bradycardia, unspecified: Secondary | ICD-10-CM | POA: Diagnosis not present

## 2019-09-08 DIAGNOSIS — Z953 Presence of xenogenic heart valve: Secondary | ICD-10-CM | POA: Diagnosis not present

## 2019-09-08 DIAGNOSIS — I350 Nonrheumatic aortic (valve) stenosis: Secondary | ICD-10-CM | POA: Diagnosis not present

## 2019-09-09 DIAGNOSIS — Z953 Presence of xenogenic heart valve: Secondary | ICD-10-CM | POA: Diagnosis not present

## 2019-09-09 DIAGNOSIS — I350 Nonrheumatic aortic (valve) stenosis: Secondary | ICD-10-CM | POA: Diagnosis not present

## 2019-09-11 DIAGNOSIS — I350 Nonrheumatic aortic (valve) stenosis: Secondary | ICD-10-CM | POA: Diagnosis not present

## 2019-09-11 DIAGNOSIS — Z953 Presence of xenogenic heart valve: Secondary | ICD-10-CM | POA: Diagnosis not present

## 2019-09-16 DIAGNOSIS — I350 Nonrheumatic aortic (valve) stenosis: Secondary | ICD-10-CM | POA: Diagnosis not present

## 2019-09-16 DIAGNOSIS — Z953 Presence of xenogenic heart valve: Secondary | ICD-10-CM | POA: Diagnosis not present

## 2019-09-17 DIAGNOSIS — E039 Hypothyroidism, unspecified: Secondary | ICD-10-CM | POA: Diagnosis not present

## 2019-09-17 DIAGNOSIS — Z953 Presence of xenogenic heart valve: Secondary | ICD-10-CM | POA: Diagnosis not present

## 2019-09-17 DIAGNOSIS — I10 Essential (primary) hypertension: Secondary | ICD-10-CM | POA: Diagnosis not present

## 2019-09-17 DIAGNOSIS — M6281 Muscle weakness (generalized): Secondary | ICD-10-CM | POA: Diagnosis not present

## 2019-09-17 DIAGNOSIS — I350 Nonrheumatic aortic (valve) stenosis: Secondary | ICD-10-CM | POA: Diagnosis not present

## 2019-09-17 DIAGNOSIS — R001 Bradycardia, unspecified: Secondary | ICD-10-CM | POA: Diagnosis not present

## 2019-09-17 DIAGNOSIS — E785 Hyperlipidemia, unspecified: Secondary | ICD-10-CM | POA: Diagnosis not present

## 2019-09-17 DIAGNOSIS — I4891 Unspecified atrial fibrillation: Secondary | ICD-10-CM | POA: Diagnosis not present

## 2019-09-17 DIAGNOSIS — I5032 Chronic diastolic (congestive) heart failure: Secondary | ICD-10-CM | POA: Diagnosis not present

## 2019-09-18 DIAGNOSIS — Z953 Presence of xenogenic heart valve: Secondary | ICD-10-CM | POA: Diagnosis not present

## 2019-09-18 DIAGNOSIS — I350 Nonrheumatic aortic (valve) stenosis: Secondary | ICD-10-CM | POA: Diagnosis not present

## 2019-09-22 DIAGNOSIS — I350 Nonrheumatic aortic (valve) stenosis: Secondary | ICD-10-CM | POA: Diagnosis not present

## 2019-09-22 DIAGNOSIS — Z953 Presence of xenogenic heart valve: Secondary | ICD-10-CM | POA: Diagnosis not present

## 2019-09-23 DIAGNOSIS — Z953 Presence of xenogenic heart valve: Secondary | ICD-10-CM | POA: Diagnosis not present

## 2019-09-23 DIAGNOSIS — I350 Nonrheumatic aortic (valve) stenosis: Secondary | ICD-10-CM | POA: Diagnosis not present

## 2019-09-25 DIAGNOSIS — I350 Nonrheumatic aortic (valve) stenosis: Secondary | ICD-10-CM | POA: Diagnosis not present

## 2019-09-25 DIAGNOSIS — Z953 Presence of xenogenic heart valve: Secondary | ICD-10-CM | POA: Diagnosis not present

## 2019-09-29 DIAGNOSIS — I350 Nonrheumatic aortic (valve) stenosis: Secondary | ICD-10-CM | POA: Diagnosis not present

## 2019-09-29 DIAGNOSIS — Z953 Presence of xenogenic heart valve: Secondary | ICD-10-CM | POA: Diagnosis not present

## 2019-09-30 DIAGNOSIS — I10 Essential (primary) hypertension: Secondary | ICD-10-CM | POA: Diagnosis not present

## 2019-09-30 DIAGNOSIS — Z953 Presence of xenogenic heart valve: Secondary | ICD-10-CM | POA: Diagnosis not present

## 2019-09-30 DIAGNOSIS — I350 Nonrheumatic aortic (valve) stenosis: Secondary | ICD-10-CM | POA: Diagnosis not present

## 2019-10-02 DIAGNOSIS — Z953 Presence of xenogenic heart valve: Secondary | ICD-10-CM | POA: Diagnosis not present

## 2019-10-02 DIAGNOSIS — I350 Nonrheumatic aortic (valve) stenosis: Secondary | ICD-10-CM | POA: Diagnosis not present

## 2019-10-03 DIAGNOSIS — M6281 Muscle weakness (generalized): Secondary | ICD-10-CM | POA: Diagnosis not present

## 2019-10-03 DIAGNOSIS — I10 Essential (primary) hypertension: Secondary | ICD-10-CM | POA: Diagnosis not present

## 2019-10-03 DIAGNOSIS — I4891 Unspecified atrial fibrillation: Secondary | ICD-10-CM | POA: Diagnosis not present

## 2019-10-03 DIAGNOSIS — I5032 Chronic diastolic (congestive) heart failure: Secondary | ICD-10-CM | POA: Diagnosis not present

## 2019-10-03 DIAGNOSIS — E785 Hyperlipidemia, unspecified: Secondary | ICD-10-CM | POA: Diagnosis not present

## 2019-10-03 DIAGNOSIS — I350 Nonrheumatic aortic (valve) stenosis: Secondary | ICD-10-CM | POA: Diagnosis not present

## 2019-10-03 DIAGNOSIS — R001 Bradycardia, unspecified: Secondary | ICD-10-CM | POA: Diagnosis not present

## 2019-10-03 DIAGNOSIS — E039 Hypothyroidism, unspecified: Secondary | ICD-10-CM | POA: Diagnosis not present

## 2019-10-06 DIAGNOSIS — I350 Nonrheumatic aortic (valve) stenosis: Secondary | ICD-10-CM | POA: Diagnosis not present

## 2019-10-06 DIAGNOSIS — Z953 Presence of xenogenic heart valve: Secondary | ICD-10-CM | POA: Diagnosis not present

## 2019-10-06 DIAGNOSIS — I10 Essential (primary) hypertension: Secondary | ICD-10-CM | POA: Diagnosis not present

## 2019-10-06 DIAGNOSIS — I4819 Other persistent atrial fibrillation: Secondary | ICD-10-CM | POA: Diagnosis not present

## 2019-10-06 DIAGNOSIS — I2510 Atherosclerotic heart disease of native coronary artery without angina pectoris: Secondary | ICD-10-CM | POA: Diagnosis not present

## 2019-10-07 DIAGNOSIS — I350 Nonrheumatic aortic (valve) stenosis: Secondary | ICD-10-CM | POA: Diagnosis not present

## 2019-10-07 DIAGNOSIS — Z953 Presence of xenogenic heart valve: Secondary | ICD-10-CM | POA: Diagnosis not present

## 2019-10-09 DIAGNOSIS — I350 Nonrheumatic aortic (valve) stenosis: Secondary | ICD-10-CM | POA: Diagnosis not present

## 2019-10-09 DIAGNOSIS — Z953 Presence of xenogenic heart valve: Secondary | ICD-10-CM | POA: Diagnosis not present

## 2019-10-13 DIAGNOSIS — Z953 Presence of xenogenic heart valve: Secondary | ICD-10-CM | POA: Diagnosis not present

## 2019-10-13 DIAGNOSIS — I350 Nonrheumatic aortic (valve) stenosis: Secondary | ICD-10-CM | POA: Diagnosis not present

## 2019-10-14 DIAGNOSIS — Z953 Presence of xenogenic heart valve: Secondary | ICD-10-CM | POA: Diagnosis not present

## 2019-10-14 DIAGNOSIS — I350 Nonrheumatic aortic (valve) stenosis: Secondary | ICD-10-CM | POA: Diagnosis not present

## 2019-10-16 DIAGNOSIS — I350 Nonrheumatic aortic (valve) stenosis: Secondary | ICD-10-CM | POA: Diagnosis not present

## 2019-10-16 DIAGNOSIS — Z953 Presence of xenogenic heart valve: Secondary | ICD-10-CM | POA: Diagnosis not present

## 2019-10-19 DIAGNOSIS — I10 Essential (primary) hypertension: Secondary | ICD-10-CM | POA: Diagnosis not present

## 2019-10-19 DIAGNOSIS — R001 Bradycardia, unspecified: Secondary | ICD-10-CM | POA: Diagnosis not present

## 2019-10-19 DIAGNOSIS — I4891 Unspecified atrial fibrillation: Secondary | ICD-10-CM | POA: Diagnosis not present

## 2019-10-19 DIAGNOSIS — E039 Hypothyroidism, unspecified: Secondary | ICD-10-CM | POA: Diagnosis not present

## 2019-10-19 DIAGNOSIS — I350 Nonrheumatic aortic (valve) stenosis: Secondary | ICD-10-CM | POA: Diagnosis not present

## 2019-10-19 DIAGNOSIS — I5032 Chronic diastolic (congestive) heart failure: Secondary | ICD-10-CM | POA: Diagnosis not present

## 2019-10-19 DIAGNOSIS — E785 Hyperlipidemia, unspecified: Secondary | ICD-10-CM | POA: Diagnosis not present

## 2019-10-19 DIAGNOSIS — M6281 Muscle weakness (generalized): Secondary | ICD-10-CM | POA: Diagnosis not present

## 2019-10-19 DIAGNOSIS — Z7689 Persons encountering health services in other specified circumstances: Secondary | ICD-10-CM | POA: Diagnosis not present

## 2019-10-20 DIAGNOSIS — I350 Nonrheumatic aortic (valve) stenosis: Secondary | ICD-10-CM | POA: Diagnosis not present

## 2019-10-20 DIAGNOSIS — Z953 Presence of xenogenic heart valve: Secondary | ICD-10-CM | POA: Diagnosis not present

## 2019-10-21 DIAGNOSIS — I350 Nonrheumatic aortic (valve) stenosis: Secondary | ICD-10-CM | POA: Diagnosis not present

## 2019-10-21 DIAGNOSIS — Z953 Presence of xenogenic heart valve: Secondary | ICD-10-CM | POA: Diagnosis not present

## 2019-10-24 DIAGNOSIS — I10 Essential (primary) hypertension: Secondary | ICD-10-CM | POA: Diagnosis not present

## 2019-10-24 DIAGNOSIS — I4891 Unspecified atrial fibrillation: Secondary | ICD-10-CM | POA: Diagnosis not present

## 2019-10-24 DIAGNOSIS — I350 Nonrheumatic aortic (valve) stenosis: Secondary | ICD-10-CM | POA: Diagnosis not present

## 2019-10-25 ENCOUNTER — Other Ambulatory Visit: Payer: Self-pay | Admitting: Internal Medicine

## 2019-10-28 ENCOUNTER — Telehealth: Payer: Self-pay | Admitting: Internal Medicine

## 2019-10-28 NOTE — Telephone Encounter (Signed)
Please refer to message below.  Attempted to contact patient via telephone this afternoon.  No answer left detailed message asking to please return my phone call.  Patient scheduled with PCP, Dolan Amen, on 12/12/2019 at 2:15 pm.  Appointment card mailed.  Forwarding back to [employer].

## 2019-10-28 NOTE — Telephone Encounter (Signed)
-----   Message from Steffanie Rainwater, MD sent at 10/28/2019  8:48 AM EDT ----- Regarding: Overdue for an appt Please schedule an appt for this patient. Last one was a year ago. Thanks.

## 2019-10-29 DIAGNOSIS — I5032 Chronic diastolic (congestive) heart failure: Secondary | ICD-10-CM | POA: Diagnosis not present

## 2019-10-29 DIAGNOSIS — I350 Nonrheumatic aortic (valve) stenosis: Secondary | ICD-10-CM | POA: Diagnosis not present

## 2019-10-29 DIAGNOSIS — Z0000 Encounter for general adult medical examination without abnormal findings: Secondary | ICD-10-CM | POA: Diagnosis not present

## 2019-10-29 DIAGNOSIS — I10 Essential (primary) hypertension: Secondary | ICD-10-CM | POA: Diagnosis not present

## 2019-10-29 DIAGNOSIS — Z23 Encounter for immunization: Secondary | ICD-10-CM | POA: Diagnosis not present

## 2019-10-29 DIAGNOSIS — I4891 Unspecified atrial fibrillation: Secondary | ICD-10-CM | POA: Diagnosis not present

## 2019-10-29 DIAGNOSIS — E785 Hyperlipidemia, unspecified: Secondary | ICD-10-CM | POA: Diagnosis not present

## 2019-10-29 DIAGNOSIS — M6281 Muscle weakness (generalized): Secondary | ICD-10-CM | POA: Diagnosis not present

## 2019-10-29 DIAGNOSIS — E039 Hypothyroidism, unspecified: Secondary | ICD-10-CM | POA: Diagnosis not present

## 2019-11-04 DIAGNOSIS — Z131 Encounter for screening for diabetes mellitus: Secondary | ICD-10-CM | POA: Diagnosis not present

## 2019-11-04 DIAGNOSIS — E039 Hypothyroidism, unspecified: Secondary | ICD-10-CM | POA: Diagnosis not present

## 2019-11-04 DIAGNOSIS — Z136 Encounter for screening for cardiovascular disorders: Secondary | ICD-10-CM | POA: Diagnosis not present

## 2019-11-04 DIAGNOSIS — Z8616 Personal history of COVID-19: Secondary | ICD-10-CM | POA: Diagnosis not present

## 2019-11-14 DIAGNOSIS — E039 Hypothyroidism, unspecified: Secondary | ICD-10-CM | POA: Diagnosis not present

## 2019-11-14 DIAGNOSIS — I10 Essential (primary) hypertension: Secondary | ICD-10-CM | POA: Diagnosis not present

## 2019-11-14 DIAGNOSIS — E785 Hyperlipidemia, unspecified: Secondary | ICD-10-CM | POA: Diagnosis not present

## 2019-11-14 DIAGNOSIS — Z23 Encounter for immunization: Secondary | ICD-10-CM | POA: Diagnosis not present

## 2019-11-14 DIAGNOSIS — I4891 Unspecified atrial fibrillation: Secondary | ICD-10-CM | POA: Diagnosis not present

## 2019-11-14 DIAGNOSIS — I5032 Chronic diastolic (congestive) heart failure: Secondary | ICD-10-CM | POA: Diagnosis not present

## 2019-11-14 DIAGNOSIS — E538 Deficiency of other specified B group vitamins: Secondary | ICD-10-CM | POA: Diagnosis not present

## 2019-11-14 DIAGNOSIS — Z8616 Personal history of COVID-19: Secondary | ICD-10-CM | POA: Diagnosis not present

## 2019-12-12 ENCOUNTER — Encounter: Payer: Medicare Other | Admitting: Internal Medicine

## 2019-12-12 DIAGNOSIS — Z23 Encounter for immunization: Secondary | ICD-10-CM | POA: Diagnosis not present

## 2019-12-12 DIAGNOSIS — I4891 Unspecified atrial fibrillation: Secondary | ICD-10-CM | POA: Diagnosis not present

## 2019-12-12 DIAGNOSIS — I5032 Chronic diastolic (congestive) heart failure: Secondary | ICD-10-CM | POA: Diagnosis not present

## 2019-12-12 DIAGNOSIS — E538 Deficiency of other specified B group vitamins: Secondary | ICD-10-CM | POA: Diagnosis not present

## 2019-12-12 DIAGNOSIS — E785 Hyperlipidemia, unspecified: Secondary | ICD-10-CM | POA: Diagnosis not present

## 2019-12-12 DIAGNOSIS — I10 Essential (primary) hypertension: Secondary | ICD-10-CM | POA: Diagnosis not present

## 2019-12-12 DIAGNOSIS — E039 Hypothyroidism, unspecified: Secondary | ICD-10-CM | POA: Diagnosis not present

## 2020-01-23 DIAGNOSIS — Z8616 Personal history of COVID-19: Secondary | ICD-10-CM | POA: Diagnosis not present

## 2020-01-23 DIAGNOSIS — I350 Nonrheumatic aortic (valve) stenosis: Secondary | ICD-10-CM | POA: Diagnosis not present

## 2020-01-23 DIAGNOSIS — I5032 Chronic diastolic (congestive) heart failure: Secondary | ICD-10-CM | POA: Diagnosis not present

## 2020-01-23 DIAGNOSIS — E785 Hyperlipidemia, unspecified: Secondary | ICD-10-CM | POA: Diagnosis not present

## 2020-01-23 DIAGNOSIS — E538 Deficiency of other specified B group vitamins: Secondary | ICD-10-CM | POA: Diagnosis not present

## 2020-01-23 DIAGNOSIS — I4891 Unspecified atrial fibrillation: Secondary | ICD-10-CM | POA: Diagnosis not present

## 2020-01-23 DIAGNOSIS — I10 Essential (primary) hypertension: Secondary | ICD-10-CM | POA: Diagnosis not present

## 2020-01-23 DIAGNOSIS — E039 Hypothyroidism, unspecified: Secondary | ICD-10-CM | POA: Diagnosis not present

## 2020-02-20 DIAGNOSIS — I4891 Unspecified atrial fibrillation: Secondary | ICD-10-CM | POA: Diagnosis not present

## 2020-02-20 DIAGNOSIS — I10 Essential (primary) hypertension: Secondary | ICD-10-CM | POA: Diagnosis not present

## 2020-02-20 DIAGNOSIS — E785 Hyperlipidemia, unspecified: Secondary | ICD-10-CM | POA: Diagnosis not present

## 2020-02-20 DIAGNOSIS — Z7189 Other specified counseling: Secondary | ICD-10-CM | POA: Diagnosis not present

## 2020-02-20 DIAGNOSIS — E538 Deficiency of other specified B group vitamins: Secondary | ICD-10-CM | POA: Diagnosis not present

## 2020-02-20 DIAGNOSIS — I5032 Chronic diastolic (congestive) heart failure: Secondary | ICD-10-CM | POA: Diagnosis not present

## 2020-02-20 DIAGNOSIS — E039 Hypothyroidism, unspecified: Secondary | ICD-10-CM | POA: Diagnosis not present

## 2020-02-20 DIAGNOSIS — I350 Nonrheumatic aortic (valve) stenosis: Secondary | ICD-10-CM | POA: Diagnosis not present

## 2020-04-02 DIAGNOSIS — I350 Nonrheumatic aortic (valve) stenosis: Secondary | ICD-10-CM | POA: Diagnosis not present

## 2020-04-02 DIAGNOSIS — I4891 Unspecified atrial fibrillation: Secondary | ICD-10-CM | POA: Diagnosis not present

## 2020-04-02 DIAGNOSIS — E538 Deficiency of other specified B group vitamins: Secondary | ICD-10-CM | POA: Diagnosis not present

## 2020-04-02 DIAGNOSIS — I5032 Chronic diastolic (congestive) heart failure: Secondary | ICD-10-CM | POA: Diagnosis not present

## 2020-04-02 DIAGNOSIS — I10 Essential (primary) hypertension: Secondary | ICD-10-CM | POA: Diagnosis not present

## 2020-04-02 DIAGNOSIS — E785 Hyperlipidemia, unspecified: Secondary | ICD-10-CM | POA: Diagnosis not present

## 2020-04-02 DIAGNOSIS — E039 Hypothyroidism, unspecified: Secondary | ICD-10-CM | POA: Diagnosis not present

## 2020-04-07 DIAGNOSIS — I4819 Other persistent atrial fibrillation: Secondary | ICD-10-CM | POA: Diagnosis not present

## 2020-04-07 DIAGNOSIS — Z953 Presence of xenogenic heart valve: Secondary | ICD-10-CM | POA: Diagnosis not present

## 2020-04-07 DIAGNOSIS — I10 Essential (primary) hypertension: Secondary | ICD-10-CM | POA: Diagnosis not present

## 2020-04-07 DIAGNOSIS — I2510 Atherosclerotic heart disease of native coronary artery without angina pectoris: Secondary | ICD-10-CM | POA: Diagnosis not present

## 2020-04-23 ENCOUNTER — Other Ambulatory Visit: Payer: Self-pay | Admitting: Student

## 2020-04-29 ENCOUNTER — Other Ambulatory Visit: Payer: Self-pay | Admitting: Student

## 2020-04-30 DIAGNOSIS — E039 Hypothyroidism, unspecified: Secondary | ICD-10-CM | POA: Diagnosis not present

## 2020-04-30 DIAGNOSIS — E785 Hyperlipidemia, unspecified: Secondary | ICD-10-CM | POA: Diagnosis not present

## 2020-04-30 DIAGNOSIS — M6281 Muscle weakness (generalized): Secondary | ICD-10-CM | POA: Diagnosis not present

## 2020-04-30 DIAGNOSIS — I350 Nonrheumatic aortic (valve) stenosis: Secondary | ICD-10-CM | POA: Diagnosis not present

## 2020-04-30 DIAGNOSIS — I10 Essential (primary) hypertension: Secondary | ICD-10-CM | POA: Diagnosis not present

## 2020-04-30 DIAGNOSIS — I4891 Unspecified atrial fibrillation: Secondary | ICD-10-CM | POA: Diagnosis not present

## 2020-04-30 DIAGNOSIS — E538 Deficiency of other specified B group vitamins: Secondary | ICD-10-CM | POA: Diagnosis not present

## 2020-04-30 DIAGNOSIS — R001 Bradycardia, unspecified: Secondary | ICD-10-CM | POA: Diagnosis not present

## 2020-04-30 DIAGNOSIS — I5032 Chronic diastolic (congestive) heart failure: Secondary | ICD-10-CM | POA: Diagnosis not present

## 2020-05-04 DIAGNOSIS — Z136 Encounter for screening for cardiovascular disorders: Secondary | ICD-10-CM | POA: Diagnosis not present

## 2020-05-04 DIAGNOSIS — E039 Hypothyroidism, unspecified: Secondary | ICD-10-CM | POA: Diagnosis not present

## 2020-05-13 ENCOUNTER — Other Ambulatory Visit: Payer: Self-pay | Admitting: Student

## 2020-05-28 DIAGNOSIS — E039 Hypothyroidism, unspecified: Secondary | ICD-10-CM | POA: Diagnosis not present

## 2020-05-28 DIAGNOSIS — I5032 Chronic diastolic (congestive) heart failure: Secondary | ICD-10-CM | POA: Diagnosis not present

## 2020-05-28 DIAGNOSIS — Z23 Encounter for immunization: Secondary | ICD-10-CM | POA: Diagnosis not present

## 2020-05-28 DIAGNOSIS — I10 Essential (primary) hypertension: Secondary | ICD-10-CM | POA: Diagnosis not present

## 2020-05-28 DIAGNOSIS — I4891 Unspecified atrial fibrillation: Secondary | ICD-10-CM | POA: Diagnosis not present

## 2020-05-28 DIAGNOSIS — E785 Hyperlipidemia, unspecified: Secondary | ICD-10-CM | POA: Diagnosis not present

## 2020-05-28 DIAGNOSIS — E538 Deficiency of other specified B group vitamins: Secondary | ICD-10-CM | POA: Diagnosis not present

## 2020-05-28 DIAGNOSIS — I350 Nonrheumatic aortic (valve) stenosis: Secondary | ICD-10-CM | POA: Diagnosis not present

## 2020-06-10 ENCOUNTER — Other Ambulatory Visit: Payer: Self-pay | Admitting: Student

## 2020-08-06 DIAGNOSIS — I350 Nonrheumatic aortic (valve) stenosis: Secondary | ICD-10-CM | POA: Diagnosis not present

## 2020-08-06 DIAGNOSIS — I10 Essential (primary) hypertension: Secondary | ICD-10-CM | POA: Diagnosis not present

## 2020-08-06 DIAGNOSIS — E538 Deficiency of other specified B group vitamins: Secondary | ICD-10-CM | POA: Diagnosis not present

## 2020-08-06 DIAGNOSIS — R001 Bradycardia, unspecified: Secondary | ICD-10-CM | POA: Diagnosis not present

## 2020-08-06 DIAGNOSIS — Z131 Encounter for screening for diabetes mellitus: Secondary | ICD-10-CM | POA: Diagnosis not present

## 2020-08-06 DIAGNOSIS — E039 Hypothyroidism, unspecified: Secondary | ICD-10-CM | POA: Diagnosis not present

## 2020-08-06 DIAGNOSIS — I5032 Chronic diastolic (congestive) heart failure: Secondary | ICD-10-CM | POA: Diagnosis not present

## 2020-08-06 DIAGNOSIS — I4891 Unspecified atrial fibrillation: Secondary | ICD-10-CM | POA: Diagnosis not present

## 2020-08-06 DIAGNOSIS — E785 Hyperlipidemia, unspecified: Secondary | ICD-10-CM | POA: Diagnosis not present

## 2020-09-21 ENCOUNTER — Other Ambulatory Visit: Payer: Self-pay | Admitting: Student

## 2020-10-05 DIAGNOSIS — E039 Hypothyroidism, unspecified: Secondary | ICD-10-CM | POA: Diagnosis not present

## 2020-10-05 DIAGNOSIS — E785 Hyperlipidemia, unspecified: Secondary | ICD-10-CM | POA: Diagnosis not present

## 2020-10-05 DIAGNOSIS — I10 Essential (primary) hypertension: Secondary | ICD-10-CM | POA: Diagnosis not present

## 2020-10-05 DIAGNOSIS — I4891 Unspecified atrial fibrillation: Secondary | ICD-10-CM | POA: Diagnosis not present

## 2020-10-12 DIAGNOSIS — E538 Deficiency of other specified B group vitamins: Secondary | ICD-10-CM | POA: Diagnosis not present

## 2020-10-12 DIAGNOSIS — E039 Hypothyroidism, unspecified: Secondary | ICD-10-CM | POA: Diagnosis not present

## 2020-10-12 DIAGNOSIS — I10 Essential (primary) hypertension: Secondary | ICD-10-CM | POA: Diagnosis not present

## 2020-10-12 DIAGNOSIS — Z131 Encounter for screening for diabetes mellitus: Secondary | ICD-10-CM | POA: Diagnosis not present

## 2020-10-29 DIAGNOSIS — E785 Hyperlipidemia, unspecified: Secondary | ICD-10-CM | POA: Diagnosis not present

## 2020-10-29 DIAGNOSIS — I4891 Unspecified atrial fibrillation: Secondary | ICD-10-CM | POA: Diagnosis not present

## 2020-10-29 DIAGNOSIS — I10 Essential (primary) hypertension: Secondary | ICD-10-CM | POA: Diagnosis not present

## 2020-10-29 DIAGNOSIS — I350 Nonrheumatic aortic (valve) stenosis: Secondary | ICD-10-CM | POA: Diagnosis not present

## 2020-10-29 DIAGNOSIS — E039 Hypothyroidism, unspecified: Secondary | ICD-10-CM | POA: Diagnosis not present

## 2020-10-29 DIAGNOSIS — Z23 Encounter for immunization: Secondary | ICD-10-CM | POA: Diagnosis not present

## 2020-10-29 DIAGNOSIS — E538 Deficiency of other specified B group vitamins: Secondary | ICD-10-CM | POA: Diagnosis not present

## 2020-10-29 DIAGNOSIS — I5032 Chronic diastolic (congestive) heart failure: Secondary | ICD-10-CM | POA: Diagnosis not present

## 2020-10-29 DIAGNOSIS — R001 Bradycardia, unspecified: Secondary | ICD-10-CM | POA: Diagnosis not present

## 2020-11-02 DIAGNOSIS — E785 Hyperlipidemia, unspecified: Secondary | ICD-10-CM | POA: Diagnosis not present

## 2020-11-02 DIAGNOSIS — E039 Hypothyroidism, unspecified: Secondary | ICD-10-CM | POA: Diagnosis not present

## 2020-11-02 DIAGNOSIS — I10 Essential (primary) hypertension: Secondary | ICD-10-CM | POA: Diagnosis not present

## 2020-11-02 DIAGNOSIS — I4891 Unspecified atrial fibrillation: Secondary | ICD-10-CM | POA: Diagnosis not present

## 2020-12-04 DIAGNOSIS — E785 Hyperlipidemia, unspecified: Secondary | ICD-10-CM | POA: Diagnosis not present

## 2020-12-04 DIAGNOSIS — I10 Essential (primary) hypertension: Secondary | ICD-10-CM | POA: Diagnosis not present

## 2020-12-04 DIAGNOSIS — I4891 Unspecified atrial fibrillation: Secondary | ICD-10-CM | POA: Diagnosis not present

## 2020-12-04 DIAGNOSIS — E039 Hypothyroidism, unspecified: Secondary | ICD-10-CM | POA: Diagnosis not present

## 2021-01-05 DIAGNOSIS — E785 Hyperlipidemia, unspecified: Secondary | ICD-10-CM | POA: Diagnosis not present

## 2021-01-05 DIAGNOSIS — E039 Hypothyroidism, unspecified: Secondary | ICD-10-CM | POA: Diagnosis not present

## 2021-01-05 DIAGNOSIS — I10 Essential (primary) hypertension: Secondary | ICD-10-CM | POA: Diagnosis not present

## 2021-01-05 DIAGNOSIS — I4891 Unspecified atrial fibrillation: Secondary | ICD-10-CM | POA: Diagnosis not present

## 2021-02-07 DIAGNOSIS — I10 Essential (primary) hypertension: Secondary | ICD-10-CM | POA: Diagnosis not present

## 2021-02-07 DIAGNOSIS — E785 Hyperlipidemia, unspecified: Secondary | ICD-10-CM | POA: Diagnosis not present

## 2021-02-07 DIAGNOSIS — E039 Hypothyroidism, unspecified: Secondary | ICD-10-CM | POA: Diagnosis not present

## 2021-02-07 DIAGNOSIS — I4891 Unspecified atrial fibrillation: Secondary | ICD-10-CM | POA: Diagnosis not present

## 2021-02-18 DIAGNOSIS — E785 Hyperlipidemia, unspecified: Secondary | ICD-10-CM | POA: Diagnosis not present

## 2021-02-18 DIAGNOSIS — I5032 Chronic diastolic (congestive) heart failure: Secondary | ICD-10-CM | POA: Diagnosis not present

## 2021-02-18 DIAGNOSIS — E538 Deficiency of other specified B group vitamins: Secondary | ICD-10-CM | POA: Diagnosis not present

## 2021-02-18 DIAGNOSIS — I4891 Unspecified atrial fibrillation: Secondary | ICD-10-CM | POA: Diagnosis not present

## 2021-02-18 DIAGNOSIS — I350 Nonrheumatic aortic (valve) stenosis: Secondary | ICD-10-CM | POA: Diagnosis not present

## 2021-02-18 DIAGNOSIS — E039 Hypothyroidism, unspecified: Secondary | ICD-10-CM | POA: Diagnosis not present

## 2021-02-18 DIAGNOSIS — Z0000 Encounter for general adult medical examination without abnormal findings: Secondary | ICD-10-CM | POA: Diagnosis not present

## 2021-02-18 DIAGNOSIS — I10 Essential (primary) hypertension: Secondary | ICD-10-CM | POA: Diagnosis not present

## 2021-02-18 DIAGNOSIS — Z7189 Other specified counseling: Secondary | ICD-10-CM | POA: Diagnosis not present

## 2021-03-01 DIAGNOSIS — E785 Hyperlipidemia, unspecified: Secondary | ICD-10-CM | POA: Diagnosis not present

## 2021-03-01 DIAGNOSIS — I4891 Unspecified atrial fibrillation: Secondary | ICD-10-CM | POA: Diagnosis not present

## 2021-03-01 DIAGNOSIS — I10 Essential (primary) hypertension: Secondary | ICD-10-CM | POA: Diagnosis not present

## 2021-03-01 DIAGNOSIS — E039 Hypothyroidism, unspecified: Secondary | ICD-10-CM | POA: Diagnosis not present

## 2021-03-24 DIAGNOSIS — I4891 Unspecified atrial fibrillation: Secondary | ICD-10-CM | POA: Diagnosis not present

## 2021-03-24 DIAGNOSIS — I10 Essential (primary) hypertension: Secondary | ICD-10-CM | POA: Diagnosis not present

## 2021-03-24 DIAGNOSIS — E785 Hyperlipidemia, unspecified: Secondary | ICD-10-CM | POA: Diagnosis not present

## 2021-03-24 DIAGNOSIS — E039 Hypothyroidism, unspecified: Secondary | ICD-10-CM | POA: Diagnosis not present

## 2021-04-23 DIAGNOSIS — E785 Hyperlipidemia, unspecified: Secondary | ICD-10-CM | POA: Diagnosis not present

## 2021-04-23 DIAGNOSIS — I4891 Unspecified atrial fibrillation: Secondary | ICD-10-CM | POA: Diagnosis not present

## 2021-04-23 DIAGNOSIS — I10 Essential (primary) hypertension: Secondary | ICD-10-CM | POA: Diagnosis not present

## 2021-04-23 DIAGNOSIS — E039 Hypothyroidism, unspecified: Secondary | ICD-10-CM | POA: Diagnosis not present

## 2021-05-23 DIAGNOSIS — E039 Hypothyroidism, unspecified: Secondary | ICD-10-CM | POA: Diagnosis not present

## 2021-05-23 DIAGNOSIS — I10 Essential (primary) hypertension: Secondary | ICD-10-CM | POA: Diagnosis not present

## 2021-05-23 DIAGNOSIS — E785 Hyperlipidemia, unspecified: Secondary | ICD-10-CM | POA: Diagnosis not present

## 2021-05-23 DIAGNOSIS — I5032 Chronic diastolic (congestive) heart failure: Secondary | ICD-10-CM | POA: Diagnosis not present

## 2021-05-23 DIAGNOSIS — I4891 Unspecified atrial fibrillation: Secondary | ICD-10-CM | POA: Diagnosis not present

## 2021-05-23 DIAGNOSIS — I350 Nonrheumatic aortic (valve) stenosis: Secondary | ICD-10-CM | POA: Diagnosis not present

## 2021-05-23 DIAGNOSIS — E538 Deficiency of other specified B group vitamins: Secondary | ICD-10-CM | POA: Diagnosis not present

## 2021-05-23 DIAGNOSIS — Z7189 Other specified counseling: Secondary | ICD-10-CM | POA: Diagnosis not present

## 2021-07-06 DIAGNOSIS — I10 Essential (primary) hypertension: Secondary | ICD-10-CM | POA: Diagnosis not present

## 2021-07-06 DIAGNOSIS — E039 Hypothyroidism, unspecified: Secondary | ICD-10-CM | POA: Diagnosis not present

## 2021-07-30 DIAGNOSIS — E039 Hypothyroidism, unspecified: Secondary | ICD-10-CM | POA: Diagnosis not present

## 2021-07-30 DIAGNOSIS — I10 Essential (primary) hypertension: Secondary | ICD-10-CM | POA: Diagnosis not present

## 2021-08-15 DIAGNOSIS — I10 Essential (primary) hypertension: Secondary | ICD-10-CM | POA: Diagnosis not present

## 2021-08-15 DIAGNOSIS — E039 Hypothyroidism, unspecified: Secondary | ICD-10-CM | POA: Diagnosis not present

## 2021-10-06 DIAGNOSIS — E039 Hypothyroidism, unspecified: Secondary | ICD-10-CM | POA: Diagnosis not present

## 2021-10-06 DIAGNOSIS — I10 Essential (primary) hypertension: Secondary | ICD-10-CM | POA: Diagnosis not present

## 2021-11-07 DIAGNOSIS — E039 Hypothyroidism, unspecified: Secondary | ICD-10-CM | POA: Diagnosis not present

## 2021-11-07 DIAGNOSIS — I10 Essential (primary) hypertension: Secondary | ICD-10-CM | POA: Diagnosis not present

## 2021-11-22 DIAGNOSIS — E039 Hypothyroidism, unspecified: Secondary | ICD-10-CM | POA: Diagnosis not present

## 2021-11-22 DIAGNOSIS — I10 Essential (primary) hypertension: Secondary | ICD-10-CM | POA: Diagnosis not present

## 2021-12-05 DIAGNOSIS — R739 Hyperglycemia, unspecified: Secondary | ICD-10-CM | POA: Diagnosis not present

## 2021-12-05 DIAGNOSIS — E039 Hypothyroidism, unspecified: Secondary | ICD-10-CM | POA: Diagnosis not present

## 2021-12-05 DIAGNOSIS — E785 Hyperlipidemia, unspecified: Secondary | ICD-10-CM | POA: Diagnosis not present

## 2021-12-05 DIAGNOSIS — E559 Vitamin D deficiency, unspecified: Secondary | ICD-10-CM | POA: Diagnosis not present

## 2021-12-05 DIAGNOSIS — I10 Essential (primary) hypertension: Secondary | ICD-10-CM | POA: Diagnosis not present

## 2021-12-05 DIAGNOSIS — E538 Deficiency of other specified B group vitamins: Secondary | ICD-10-CM | POA: Diagnosis not present

## 2021-12-12 DIAGNOSIS — Z7189 Other specified counseling: Secondary | ICD-10-CM | POA: Diagnosis not present

## 2022-01-05 ENCOUNTER — Telehealth: Payer: Self-pay

## 2022-01-05 NOTE — Patient Outreach (Signed)
  Care Coordination   Initial Visit Note   01/05/2022 Name: Gabriel Sosa MRN: [id number] DOB: May 14, 1940  Gabriel Sosa is a 81 y.o. year old male who sees No primary care provider on file. for primary care. I spoke with  Gabriel Sosa by phone today.  What matters to the patients health and wellness today?  none    Goals Addressed             This Visit's Progress    COMPLETED: Care coordination Activities-No follow up required       Care Coordination Interventions: Advised patient to schedule visit with PCP. Patient currently living in [place] [facility] and currently has providers there.  Advised patient to follow up with providers as scheduled.            SDOH assessments and interventions completed:  Yes     Care Coordination Interventions:  Yes, provided   Follow up plan: No further intervention required.   Encounter Outcome:  Pt. Visit Completed   Bary Leriche, RN, MSN [facility] Care Management Care Management Coordinator Direct Line [phone]

## 2022-01-08 DIAGNOSIS — I10 Essential (primary) hypertension: Secondary | ICD-10-CM | POA: Diagnosis not present

## 2022-01-08 DIAGNOSIS — E039 Hypothyroidism, unspecified: Secondary | ICD-10-CM | POA: Diagnosis not present

## 2022-02-01 DIAGNOSIS — I5032 Chronic diastolic (congestive) heart failure: Secondary | ICD-10-CM | POA: Diagnosis not present

## 2022-02-01 DIAGNOSIS — D849 Immunodeficiency, unspecified: Secondary | ICD-10-CM | POA: Diagnosis not present

## 2022-02-01 DIAGNOSIS — E46 Unspecified protein-calorie malnutrition: Secondary | ICD-10-CM | POA: Diagnosis not present

## 2022-02-01 DIAGNOSIS — I4891 Unspecified atrial fibrillation: Secondary | ICD-10-CM | POA: Diagnosis not present

## 2022-02-01 DIAGNOSIS — E782 Mixed hyperlipidemia: Secondary | ICD-10-CM | POA: Diagnosis not present

## 2022-02-01 DIAGNOSIS — I350 Nonrheumatic aortic (valve) stenosis: Secondary | ICD-10-CM | POA: Diagnosis not present

## 2022-02-01 DIAGNOSIS — Z6841 Body Mass Index (BMI) 40.0 and over, adult: Secondary | ICD-10-CM | POA: Diagnosis not present

## 2022-02-01 DIAGNOSIS — I480 Paroxysmal atrial fibrillation: Secondary | ICD-10-CM | POA: Diagnosis not present

## 2022-02-01 DIAGNOSIS — I4819 Other persistent atrial fibrillation: Secondary | ICD-10-CM | POA: Diagnosis not present

## 2022-02-08 DIAGNOSIS — I10 Essential (primary) hypertension: Secondary | ICD-10-CM | POA: Diagnosis not present

## 2022-02-08 DIAGNOSIS — E039 Hypothyroidism, unspecified: Secondary | ICD-10-CM | POA: Diagnosis not present

## 2022-02-28 DIAGNOSIS — E559 Vitamin D deficiency, unspecified: Secondary | ICD-10-CM | POA: Diagnosis not present

## 2022-02-28 DIAGNOSIS — I4891 Unspecified atrial fibrillation: Secondary | ICD-10-CM | POA: Diagnosis not present

## 2022-02-28 DIAGNOSIS — E039 Hypothyroidism, unspecified: Secondary | ICD-10-CM | POA: Diagnosis not present

## 2022-03-09 DIAGNOSIS — I10 Essential (primary) hypertension: Secondary | ICD-10-CM | POA: Diagnosis not present

## 2022-03-09 DIAGNOSIS — E039 Hypothyroidism, unspecified: Secondary | ICD-10-CM | POA: Diagnosis not present

## 2022-03-20 DIAGNOSIS — E559 Vitamin D deficiency, unspecified: Secondary | ICD-10-CM | POA: Diagnosis not present

## 2022-03-20 DIAGNOSIS — I5032 Chronic diastolic (congestive) heart failure: Secondary | ICD-10-CM | POA: Diagnosis not present

## 2022-03-20 DIAGNOSIS — M19111 Post-traumatic osteoarthritis, right shoulder: Secondary | ICD-10-CM | POA: Diagnosis not present

## 2022-03-20 DIAGNOSIS — I4811 Longstanding persistent atrial fibrillation: Secondary | ICD-10-CM | POA: Diagnosis not present

## 2022-03-20 DIAGNOSIS — D8989 Other specified disorders involving the immune mechanism, not elsewhere classified: Secondary | ICD-10-CM | POA: Diagnosis not present

## 2022-03-20 DIAGNOSIS — D6869 Other thrombophilia: Secondary | ICD-10-CM | POA: Diagnosis not present

## 2022-03-20 DIAGNOSIS — Z7189 Other specified counseling: Secondary | ICD-10-CM | POA: Diagnosis not present

## 2022-03-29 DIAGNOSIS — Z7689 Persons encountering health services in other specified circumstances: Secondary | ICD-10-CM | POA: Diagnosis not present

## 2022-03-29 DIAGNOSIS — M19111 Post-traumatic osteoarthritis, right shoulder: Secondary | ICD-10-CM | POA: Diagnosis not present

## 2022-03-29 DIAGNOSIS — D8989 Other specified disorders involving the immune mechanism, not elsewhere classified: Secondary | ICD-10-CM | POA: Diagnosis not present

## 2022-03-29 DIAGNOSIS — I4811 Longstanding persistent atrial fibrillation: Secondary | ICD-10-CM | POA: Diagnosis not present

## 2022-03-29 DIAGNOSIS — E559 Vitamin D deficiency, unspecified: Secondary | ICD-10-CM | POA: Diagnosis not present

## 2022-03-29 DIAGNOSIS — D6869 Other thrombophilia: Secondary | ICD-10-CM | POA: Diagnosis not present

## 2022-03-29 DIAGNOSIS — I5032 Chronic diastolic (congestive) heart failure: Secondary | ICD-10-CM | POA: Diagnosis not present

## 2022-03-31 DIAGNOSIS — E039 Hypothyroidism, unspecified: Secondary | ICD-10-CM | POA: Diagnosis not present

## 2022-03-31 DIAGNOSIS — E785 Hyperlipidemia, unspecified: Secondary | ICD-10-CM | POA: Diagnosis not present

## 2022-03-31 DIAGNOSIS — I5032 Chronic diastolic (congestive) heart failure: Secondary | ICD-10-CM | POA: Diagnosis not present

## 2022-03-31 DIAGNOSIS — E559 Vitamin D deficiency, unspecified: Secondary | ICD-10-CM | POA: Diagnosis not present

## 2022-03-31 DIAGNOSIS — I10 Essential (primary) hypertension: Secondary | ICD-10-CM | POA: Diagnosis not present

## 2022-03-31 DIAGNOSIS — I4891 Unspecified atrial fibrillation: Secondary | ICD-10-CM | POA: Diagnosis not present

## 2022-04-09 DIAGNOSIS — E039 Hypothyroidism, unspecified: Secondary | ICD-10-CM | POA: Diagnosis not present

## 2022-04-09 DIAGNOSIS — I10 Essential (primary) hypertension: Secondary | ICD-10-CM | POA: Diagnosis not present

## 2022-04-17 DIAGNOSIS — I5032 Chronic diastolic (congestive) heart failure: Secondary | ICD-10-CM | POA: Diagnosis not present

## 2022-04-17 DIAGNOSIS — I10 Essential (primary) hypertension: Secondary | ICD-10-CM | POA: Diagnosis not present

## 2022-04-17 DIAGNOSIS — E782 Mixed hyperlipidemia: Secondary | ICD-10-CM | POA: Diagnosis not present

## 2022-04-17 DIAGNOSIS — I4819 Other persistent atrial fibrillation: Secondary | ICD-10-CM | POA: Diagnosis not present

## 2022-04-24 DIAGNOSIS — I10 Essential (primary) hypertension: Secondary | ICD-10-CM | POA: Diagnosis not present

## 2022-04-24 DIAGNOSIS — Z7689 Persons encountering health services in other specified circumstances: Secondary | ICD-10-CM | POA: Diagnosis not present

## 2022-04-24 DIAGNOSIS — I4819 Other persistent atrial fibrillation: Secondary | ICD-10-CM | POA: Diagnosis not present

## 2022-04-24 DIAGNOSIS — I5032 Chronic diastolic (congestive) heart failure: Secondary | ICD-10-CM | POA: Diagnosis not present

## 2022-04-24 DIAGNOSIS — E782 Mixed hyperlipidemia: Secondary | ICD-10-CM | POA: Diagnosis not present

## 2022-05-09 DIAGNOSIS — I10 Essential (primary) hypertension: Secondary | ICD-10-CM | POA: Diagnosis not present

## 2022-05-09 DIAGNOSIS — I4819 Other persistent atrial fibrillation: Secondary | ICD-10-CM | POA: Diagnosis not present

## 2022-05-09 DIAGNOSIS — I4891 Unspecified atrial fibrillation: Secondary | ICD-10-CM | POA: Diagnosis not present

## 2022-05-09 DIAGNOSIS — E782 Mixed hyperlipidemia: Secondary | ICD-10-CM | POA: Diagnosis not present

## 2022-05-09 DIAGNOSIS — E039 Hypothyroidism, unspecified: Secondary | ICD-10-CM | POA: Diagnosis not present

## 2022-05-09 DIAGNOSIS — I5032 Chronic diastolic (congestive) heart failure: Secondary | ICD-10-CM | POA: Diagnosis not present

## 2022-05-30 DIAGNOSIS — E559 Vitamin D deficiency, unspecified: Secondary | ICD-10-CM | POA: Diagnosis not present

## 2022-05-30 DIAGNOSIS — I4819 Other persistent atrial fibrillation: Secondary | ICD-10-CM | POA: Diagnosis not present

## 2022-05-30 DIAGNOSIS — E782 Mixed hyperlipidemia: Secondary | ICD-10-CM | POA: Diagnosis not present

## 2022-05-30 DIAGNOSIS — I5032 Chronic diastolic (congestive) heart failure: Secondary | ICD-10-CM | POA: Diagnosis not present

## 2022-05-30 DIAGNOSIS — I110 Hypertensive heart disease with heart failure: Secondary | ICD-10-CM | POA: Diagnosis not present

## 2022-06-09 DIAGNOSIS — I10 Essential (primary) hypertension: Secondary | ICD-10-CM | POA: Diagnosis not present

## 2022-06-09 DIAGNOSIS — E039 Hypothyroidism, unspecified: Secondary | ICD-10-CM | POA: Diagnosis not present

## 2022-06-10 DIAGNOSIS — I10 Essential (primary) hypertension: Secondary | ICD-10-CM | POA: Diagnosis not present

## 2022-06-10 DIAGNOSIS — L282 Other prurigo: Secondary | ICD-10-CM | POA: Diagnosis not present

## 2022-06-10 DIAGNOSIS — I4819 Other persistent atrial fibrillation: Secondary | ICD-10-CM | POA: Diagnosis not present

## 2022-06-10 DIAGNOSIS — W57XXXA Bitten or stung by nonvenomous insect and other nonvenomous arthropods, initial encounter: Secondary | ICD-10-CM | POA: Diagnosis not present

## 2022-06-10 DIAGNOSIS — I4891 Unspecified atrial fibrillation: Secondary | ICD-10-CM | POA: Diagnosis not present

## 2022-06-10 DIAGNOSIS — E782 Mixed hyperlipidemia: Secondary | ICD-10-CM | POA: Diagnosis not present

## 2022-06-10 DIAGNOSIS — I5032 Chronic diastolic (congestive) heart failure: Secondary | ICD-10-CM | POA: Diagnosis not present

## 2022-06-16 DIAGNOSIS — I4819 Other persistent atrial fibrillation: Secondary | ICD-10-CM | POA: Diagnosis not present

## 2022-06-16 DIAGNOSIS — I10 Essential (primary) hypertension: Secondary | ICD-10-CM | POA: Diagnosis not present

## 2022-06-16 DIAGNOSIS — L282 Other prurigo: Secondary | ICD-10-CM | POA: Diagnosis not present

## 2022-06-16 DIAGNOSIS — E782 Mixed hyperlipidemia: Secondary | ICD-10-CM | POA: Diagnosis not present

## 2022-06-16 DIAGNOSIS — I5032 Chronic diastolic (congestive) heart failure: Secondary | ICD-10-CM | POA: Diagnosis not present

## 2022-07-07 DIAGNOSIS — I5032 Chronic diastolic (congestive) heart failure: Secondary | ICD-10-CM | POA: Diagnosis not present

## 2022-07-07 DIAGNOSIS — I10 Essential (primary) hypertension: Secondary | ICD-10-CM | POA: Diagnosis not present

## 2022-07-07 DIAGNOSIS — I4819 Other persistent atrial fibrillation: Secondary | ICD-10-CM | POA: Diagnosis not present

## 2022-07-07 DIAGNOSIS — I5030 Unspecified diastolic (congestive) heart failure: Secondary | ICD-10-CM | POA: Diagnosis not present

## 2022-07-07 DIAGNOSIS — L282 Other prurigo: Secondary | ICD-10-CM | POA: Diagnosis not present

## 2022-07-07 DIAGNOSIS — E782 Mixed hyperlipidemia: Secondary | ICD-10-CM | POA: Diagnosis not present

## 2022-07-07 DIAGNOSIS — I700 Atherosclerosis of aorta: Secondary | ICD-10-CM | POA: Diagnosis not present

## 2022-07-09 DIAGNOSIS — I10 Essential (primary) hypertension: Secondary | ICD-10-CM | POA: Diagnosis not present

## 2022-07-09 DIAGNOSIS — E039 Hypothyroidism, unspecified: Secondary | ICD-10-CM | POA: Diagnosis not present

## 2022-07-24 DIAGNOSIS — E782 Mixed hyperlipidemia: Secondary | ICD-10-CM | POA: Diagnosis not present

## 2022-07-24 DIAGNOSIS — I4819 Other persistent atrial fibrillation: Secondary | ICD-10-CM | POA: Diagnosis not present

## 2022-07-24 DIAGNOSIS — Z7689 Persons encountering health services in other specified circumstances: Secondary | ICD-10-CM | POA: Diagnosis not present

## 2022-07-24 DIAGNOSIS — I10 Essential (primary) hypertension: Secondary | ICD-10-CM | POA: Diagnosis not present

## 2022-08-09 DIAGNOSIS — I4819 Other persistent atrial fibrillation: Secondary | ICD-10-CM | POA: Diagnosis not present

## 2022-08-09 DIAGNOSIS — E782 Mixed hyperlipidemia: Secondary | ICD-10-CM | POA: Diagnosis not present

## 2022-08-09 DIAGNOSIS — I10 Essential (primary) hypertension: Secondary | ICD-10-CM | POA: Diagnosis not present

## 2022-08-09 DIAGNOSIS — E039 Hypothyroidism, unspecified: Secondary | ICD-10-CM | POA: Diagnosis not present

## 2022-08-15 DIAGNOSIS — I5030 Unspecified diastolic (congestive) heart failure: Secondary | ICD-10-CM | POA: Diagnosis not present

## 2022-08-15 DIAGNOSIS — I10 Essential (primary) hypertension: Secondary | ICD-10-CM | POA: Diagnosis not present

## 2022-08-15 DIAGNOSIS — E785 Hyperlipidemia, unspecified: Secondary | ICD-10-CM | POA: Diagnosis not present

## 2022-08-15 DIAGNOSIS — B356 Tinea cruris: Secondary | ICD-10-CM | POA: Diagnosis not present

## 2022-08-15 DIAGNOSIS — I5032 Chronic diastolic (congestive) heart failure: Secondary | ICD-10-CM | POA: Diagnosis not present

## 2022-08-21 DIAGNOSIS — L282 Other prurigo: Secondary | ICD-10-CM | POA: Diagnosis not present

## 2022-08-21 DIAGNOSIS — Z7901 Long term (current) use of anticoagulants: Secondary | ICD-10-CM | POA: Diagnosis not present

## 2022-08-21 DIAGNOSIS — I5032 Chronic diastolic (congestive) heart failure: Secondary | ICD-10-CM | POA: Diagnosis not present

## 2022-08-21 DIAGNOSIS — I4819 Other persistent atrial fibrillation: Secondary | ICD-10-CM | POA: Diagnosis not present

## 2022-08-21 DIAGNOSIS — I110 Hypertensive heart disease with heart failure: Secondary | ICD-10-CM | POA: Diagnosis not present

## 2022-08-21 DIAGNOSIS — Z7189 Other specified counseling: Secondary | ICD-10-CM | POA: Diagnosis not present

## 2022-08-29 DIAGNOSIS — B356 Tinea cruris: Secondary | ICD-10-CM | POA: Diagnosis not present

## 2022-08-29 DIAGNOSIS — I5030 Unspecified diastolic (congestive) heart failure: Secondary | ICD-10-CM | POA: Diagnosis not present

## 2022-08-29 DIAGNOSIS — I4891 Unspecified atrial fibrillation: Secondary | ICD-10-CM | POA: Diagnosis not present

## 2022-08-29 DIAGNOSIS — I5032 Chronic diastolic (congestive) heart failure: Secondary | ICD-10-CM | POA: Diagnosis not present

## 2022-09-03 DIAGNOSIS — R54 Age-related physical debility: Secondary | ICD-10-CM | POA: Diagnosis not present

## 2022-09-03 DIAGNOSIS — I5032 Chronic diastolic (congestive) heart failure: Secondary | ICD-10-CM | POA: Diagnosis not present

## 2022-09-03 DIAGNOSIS — I4819 Other persistent atrial fibrillation: Secondary | ICD-10-CM | POA: Diagnosis not present

## 2022-09-03 DIAGNOSIS — I10 Essential (primary) hypertension: Secondary | ICD-10-CM | POA: Diagnosis not present

## 2022-09-03 DIAGNOSIS — L282 Other prurigo: Secondary | ICD-10-CM | POA: Diagnosis not present

## 2022-09-08 DIAGNOSIS — I4819 Other persistent atrial fibrillation: Secondary | ICD-10-CM | POA: Diagnosis not present

## 2022-09-08 DIAGNOSIS — I10 Essential (primary) hypertension: Secondary | ICD-10-CM | POA: Diagnosis not present

## 2022-09-08 DIAGNOSIS — Z7689 Persons encountering health services in other specified circumstances: Secondary | ICD-10-CM | POA: Diagnosis not present

## 2022-09-09 DIAGNOSIS — E039 Hypothyroidism, unspecified: Secondary | ICD-10-CM | POA: Diagnosis not present

## 2022-09-09 DIAGNOSIS — I10 Essential (primary) hypertension: Secondary | ICD-10-CM | POA: Diagnosis not present

## 2022-09-15 DIAGNOSIS — L282 Other prurigo: Secondary | ICD-10-CM | POA: Diagnosis not present

## 2022-09-15 DIAGNOSIS — I5032 Chronic diastolic (congestive) heart failure: Secondary | ICD-10-CM | POA: Diagnosis not present

## 2022-09-15 DIAGNOSIS — E782 Mixed hyperlipidemia: Secondary | ICD-10-CM | POA: Diagnosis not present

## 2022-09-15 DIAGNOSIS — I10 Essential (primary) hypertension: Secondary | ICD-10-CM | POA: Diagnosis not present

## 2022-09-15 DIAGNOSIS — I700 Atherosclerosis of aorta: Secondary | ICD-10-CM | POA: Diagnosis not present

## 2022-09-15 DIAGNOSIS — I4819 Other persistent atrial fibrillation: Secondary | ICD-10-CM | POA: Diagnosis not present

## 2022-10-08 DIAGNOSIS — I10 Essential (primary) hypertension: Secondary | ICD-10-CM | POA: Diagnosis not present

## 2022-10-08 DIAGNOSIS — E039 Hypothyroidism, unspecified: Secondary | ICD-10-CM | POA: Diagnosis not present

## 2022-10-09 DIAGNOSIS — I5032 Chronic diastolic (congestive) heart failure: Secondary | ICD-10-CM | POA: Diagnosis not present

## 2022-10-09 DIAGNOSIS — I10 Essential (primary) hypertension: Secondary | ICD-10-CM | POA: Diagnosis not present

## 2022-10-09 DIAGNOSIS — E782 Mixed hyperlipidemia: Secondary | ICD-10-CM | POA: Diagnosis not present

## 2022-10-09 DIAGNOSIS — I5030 Unspecified diastolic (congestive) heart failure: Secondary | ICD-10-CM | POA: Diagnosis not present

## 2022-10-09 DIAGNOSIS — I4811 Longstanding persistent atrial fibrillation: Secondary | ICD-10-CM | POA: Diagnosis not present

## 2022-11-09 DIAGNOSIS — E039 Hypothyroidism, unspecified: Secondary | ICD-10-CM | POA: Diagnosis not present

## 2022-11-09 DIAGNOSIS — I10 Essential (primary) hypertension: Secondary | ICD-10-CM | POA: Diagnosis not present

## 2022-11-15 DIAGNOSIS — E538 Deficiency of other specified B group vitamins: Secondary | ICD-10-CM | POA: Diagnosis not present

## 2022-11-15 DIAGNOSIS — Z7189 Other specified counseling: Secondary | ICD-10-CM | POA: Diagnosis not present

## 2022-11-15 DIAGNOSIS — I5032 Chronic diastolic (congestive) heart failure: Secondary | ICD-10-CM | POA: Diagnosis not present

## 2022-11-15 DIAGNOSIS — I350 Nonrheumatic aortic (valve) stenosis: Secondary | ICD-10-CM | POA: Diagnosis not present

## 2022-11-15 DIAGNOSIS — I4891 Unspecified atrial fibrillation: Secondary | ICD-10-CM | POA: Diagnosis not present

## 2022-11-15 DIAGNOSIS — E039 Hypothyroidism, unspecified: Secondary | ICD-10-CM | POA: Diagnosis not present

## 2022-11-15 DIAGNOSIS — Z0000 Encounter for general adult medical examination without abnormal findings: Secondary | ICD-10-CM | POA: Diagnosis not present

## 2022-11-15 DIAGNOSIS — I10 Essential (primary) hypertension: Secondary | ICD-10-CM | POA: Diagnosis not present

## 2022-11-15 DIAGNOSIS — I700 Atherosclerosis of aorta: Secondary | ICD-10-CM | POA: Diagnosis not present

## 2022-11-15 DIAGNOSIS — I5030 Unspecified diastolic (congestive) heart failure: Secondary | ICD-10-CM | POA: Diagnosis not present

## 2022-12-09 DIAGNOSIS — I10 Essential (primary) hypertension: Secondary | ICD-10-CM | POA: Diagnosis not present

## 2022-12-09 DIAGNOSIS — E039 Hypothyroidism, unspecified: Secondary | ICD-10-CM | POA: Diagnosis not present

## 2022-12-25 DIAGNOSIS — I482 Chronic atrial fibrillation, unspecified: Secondary | ICD-10-CM | POA: Diagnosis not present

## 2022-12-25 DIAGNOSIS — E559 Vitamin D deficiency, unspecified: Secondary | ICD-10-CM | POA: Diagnosis not present

## 2022-12-25 DIAGNOSIS — I10 Essential (primary) hypertension: Secondary | ICD-10-CM | POA: Diagnosis not present

## 2022-12-25 DIAGNOSIS — I700 Atherosclerosis of aorta: Secondary | ICD-10-CM | POA: Diagnosis not present

## 2022-12-25 DIAGNOSIS — Z7901 Long term (current) use of anticoagulants: Secondary | ICD-10-CM | POA: Diagnosis not present

## 2022-12-25 DIAGNOSIS — E782 Mixed hyperlipidemia: Secondary | ICD-10-CM | POA: Diagnosis not present

## 2022-12-25 DIAGNOSIS — D6869 Other thrombophilia: Secondary | ICD-10-CM | POA: Diagnosis not present

## 2022-12-25 DIAGNOSIS — E039 Hypothyroidism, unspecified: Secondary | ICD-10-CM | POA: Diagnosis not present

## 2022-12-25 DIAGNOSIS — Z87891 Personal history of nicotine dependence: Secondary | ICD-10-CM | POA: Diagnosis not present

## 2023-01-03 DIAGNOSIS — E782 Mixed hyperlipidemia: Secondary | ICD-10-CM | POA: Diagnosis not present

## 2023-01-03 DIAGNOSIS — I10 Essential (primary) hypertension: Secondary | ICD-10-CM | POA: Diagnosis not present

## 2023-01-03 DIAGNOSIS — I4811 Longstanding persistent atrial fibrillation: Secondary | ICD-10-CM | POA: Diagnosis not present

## 2023-01-03 DIAGNOSIS — D6869 Other thrombophilia: Secondary | ICD-10-CM | POA: Diagnosis not present

## 2023-01-03 DIAGNOSIS — Z789 Other specified health status: Secondary | ICD-10-CM | POA: Diagnosis not present

## 2023-01-03 DIAGNOSIS — I700 Atherosclerosis of aorta: Secondary | ICD-10-CM | POA: Diagnosis not present

## 2023-01-03 DIAGNOSIS — E559 Vitamin D deficiency, unspecified: Secondary | ICD-10-CM | POA: Diagnosis not present

## 2023-01-03 DIAGNOSIS — E039 Hypothyroidism, unspecified: Secondary | ICD-10-CM | POA: Diagnosis not present

## 2023-01-08 DIAGNOSIS — Z7689 Persons encountering health services in other specified circumstances: Secondary | ICD-10-CM | POA: Diagnosis not present

## 2023-01-08 DIAGNOSIS — I10 Essential (primary) hypertension: Secondary | ICD-10-CM | POA: Diagnosis not present

## 2023-01-08 DIAGNOSIS — Z789 Other specified health status: Secondary | ICD-10-CM | POA: Diagnosis not present

## 2023-01-08 DIAGNOSIS — E782 Mixed hyperlipidemia: Secondary | ICD-10-CM | POA: Diagnosis not present

## 2023-01-09 DIAGNOSIS — E039 Hypothyroidism, unspecified: Secondary | ICD-10-CM | POA: Diagnosis not present

## 2023-01-09 DIAGNOSIS — I10 Essential (primary) hypertension: Secondary | ICD-10-CM | POA: Diagnosis not present

## 2023-02-02 DIAGNOSIS — I700 Atherosclerosis of aorta: Secondary | ICD-10-CM | POA: Diagnosis not present

## 2023-02-02 DIAGNOSIS — E538 Deficiency of other specified B group vitamins: Secondary | ICD-10-CM | POA: Diagnosis not present

## 2023-02-02 DIAGNOSIS — I4891 Unspecified atrial fibrillation: Secondary | ICD-10-CM | POA: Diagnosis not present

## 2023-02-02 DIAGNOSIS — I10 Essential (primary) hypertension: Secondary | ICD-10-CM | POA: Diagnosis not present

## 2023-02-02 DIAGNOSIS — E039 Hypothyroidism, unspecified: Secondary | ICD-10-CM | POA: Diagnosis not present

## 2023-02-02 DIAGNOSIS — I5030 Unspecified diastolic (congestive) heart failure: Secondary | ICD-10-CM | POA: Diagnosis not present

## 2023-02-02 DIAGNOSIS — I5032 Chronic diastolic (congestive) heart failure: Secondary | ICD-10-CM | POA: Diagnosis not present

## 2023-02-02 DIAGNOSIS — Z0000 Encounter for general adult medical examination without abnormal findings: Secondary | ICD-10-CM | POA: Diagnosis not present

## 2023-02-02 DIAGNOSIS — Z7189 Other specified counseling: Secondary | ICD-10-CM | POA: Diagnosis not present

## 2023-02-03 DIAGNOSIS — Z1389 Encounter for screening for other disorder: Secondary | ICD-10-CM | POA: Diagnosis not present

## 2023-02-09 DIAGNOSIS — E039 Hypothyroidism, unspecified: Secondary | ICD-10-CM | POA: Diagnosis not present

## 2023-02-09 DIAGNOSIS — I10 Essential (primary) hypertension: Secondary | ICD-10-CM | POA: Diagnosis not present

## 2023-03-09 DIAGNOSIS — I10 Essential (primary) hypertension: Secondary | ICD-10-CM | POA: Diagnosis not present

## 2023-03-09 DIAGNOSIS — E039 Hypothyroidism, unspecified: Secondary | ICD-10-CM | POA: Diagnosis not present

## 2023-03-16 DIAGNOSIS — E782 Mixed hyperlipidemia: Secondary | ICD-10-CM | POA: Diagnosis not present

## 2023-03-16 DIAGNOSIS — Z139 Encounter for screening, unspecified: Secondary | ICD-10-CM | POA: Diagnosis not present

## 2023-03-16 DIAGNOSIS — Z556 Problems related to health literacy: Secondary | ICD-10-CM | POA: Diagnosis not present

## 2023-03-16 DIAGNOSIS — I700 Atherosclerosis of aorta: Secondary | ICD-10-CM | POA: Diagnosis not present

## 2023-03-16 DIAGNOSIS — I10 Essential (primary) hypertension: Secondary | ICD-10-CM | POA: Diagnosis not present

## 2023-03-16 DIAGNOSIS — Z0000 Encounter for general adult medical examination without abnormal findings: Secondary | ICD-10-CM | POA: Diagnosis not present

## 2023-03-16 DIAGNOSIS — I4819 Other persistent atrial fibrillation: Secondary | ICD-10-CM | POA: Diagnosis not present

## 2023-03-16 DIAGNOSIS — I5032 Chronic diastolic (congestive) heart failure: Secondary | ICD-10-CM | POA: Diagnosis not present

## 2023-03-16 DIAGNOSIS — I5030 Unspecified diastolic (congestive) heart failure: Secondary | ICD-10-CM | POA: Diagnosis not present

## 2023-04-09 DIAGNOSIS — E039 Hypothyroidism, unspecified: Secondary | ICD-10-CM | POA: Diagnosis not present

## 2023-04-09 DIAGNOSIS — I10 Essential (primary) hypertension: Secondary | ICD-10-CM | POA: Diagnosis not present

## 2023-04-20 DIAGNOSIS — I5032 Chronic diastolic (congestive) heart failure: Secondary | ICD-10-CM | POA: Diagnosis not present

## 2023-04-20 DIAGNOSIS — I4891 Unspecified atrial fibrillation: Secondary | ICD-10-CM | POA: Diagnosis not present

## 2023-04-20 DIAGNOSIS — E039 Hypothyroidism, unspecified: Secondary | ICD-10-CM | POA: Diagnosis not present

## 2023-04-20 DIAGNOSIS — I5030 Unspecified diastolic (congestive) heart failure: Secondary | ICD-10-CM | POA: Diagnosis not present

## 2023-04-20 DIAGNOSIS — R54 Age-related physical debility: Secondary | ICD-10-CM | POA: Diagnosis not present

## 2023-04-20 DIAGNOSIS — I110 Hypertensive heart disease with heart failure: Secondary | ICD-10-CM | POA: Diagnosis not present

## 2023-04-20 DIAGNOSIS — E782 Mixed hyperlipidemia: Secondary | ICD-10-CM | POA: Diagnosis not present

## 2023-04-24 DIAGNOSIS — D6869 Other thrombophilia: Secondary | ICD-10-CM | POA: Diagnosis not present

## 2023-04-24 DIAGNOSIS — I10 Essential (primary) hypertension: Secondary | ICD-10-CM | POA: Diagnosis not present

## 2023-04-24 DIAGNOSIS — E782 Mixed hyperlipidemia: Secondary | ICD-10-CM | POA: Diagnosis not present

## 2023-04-24 DIAGNOSIS — Z7189 Other specified counseling: Secondary | ICD-10-CM | POA: Diagnosis not present

## 2023-04-24 DIAGNOSIS — E039 Hypothyroidism, unspecified: Secondary | ICD-10-CM | POA: Diagnosis not present

## 2023-04-24 DIAGNOSIS — I4819 Other persistent atrial fibrillation: Secondary | ICD-10-CM | POA: Diagnosis not present

## 2023-04-24 DIAGNOSIS — E46 Unspecified protein-calorie malnutrition: Secondary | ICD-10-CM | POA: Diagnosis not present

## 2023-04-28 DIAGNOSIS — I5030 Unspecified diastolic (congestive) heart failure: Secondary | ICD-10-CM | POA: Diagnosis not present

## 2023-04-28 DIAGNOSIS — I10 Essential (primary) hypertension: Secondary | ICD-10-CM | POA: Diagnosis not present

## 2023-04-28 DIAGNOSIS — R54 Age-related physical debility: Secondary | ICD-10-CM | POA: Diagnosis not present

## 2023-04-28 DIAGNOSIS — E039 Hypothyroidism, unspecified: Secondary | ICD-10-CM | POA: Diagnosis not present

## 2023-04-28 DIAGNOSIS — I5032 Chronic diastolic (congestive) heart failure: Secondary | ICD-10-CM | POA: Diagnosis not present

## 2023-04-28 DIAGNOSIS — E782 Mixed hyperlipidemia: Secondary | ICD-10-CM | POA: Diagnosis not present

## 2023-04-28 DIAGNOSIS — I4891 Unspecified atrial fibrillation: Secondary | ICD-10-CM | POA: Diagnosis not present

## 2023-05-09 DIAGNOSIS — E039 Hypothyroidism, unspecified: Secondary | ICD-10-CM | POA: Diagnosis not present

## 2023-05-09 DIAGNOSIS — I10 Essential (primary) hypertension: Secondary | ICD-10-CM | POA: Diagnosis not present

## 2023-06-05 DIAGNOSIS — Z7901 Long term (current) use of anticoagulants: Secondary | ICD-10-CM | POA: Diagnosis not present

## 2023-06-05 DIAGNOSIS — E46 Unspecified protein-calorie malnutrition: Secondary | ICD-10-CM | POA: Diagnosis not present

## 2023-06-05 DIAGNOSIS — I10 Essential (primary) hypertension: Secondary | ICD-10-CM | POA: Diagnosis not present

## 2023-06-05 DIAGNOSIS — I4819 Other persistent atrial fibrillation: Secondary | ICD-10-CM | POA: Diagnosis not present

## 2023-06-05 DIAGNOSIS — Z87891 Personal history of nicotine dependence: Secondary | ICD-10-CM | POA: Diagnosis not present

## 2023-06-05 DIAGNOSIS — D6869 Other thrombophilia: Secondary | ICD-10-CM | POA: Diagnosis not present

## 2023-06-05 DIAGNOSIS — E782 Mixed hyperlipidemia: Secondary | ICD-10-CM | POA: Diagnosis not present

## 2023-06-05 DIAGNOSIS — Z6841 Body Mass Index (BMI) 40.0 and over, adult: Secondary | ICD-10-CM | POA: Diagnosis not present

## 2023-06-05 DIAGNOSIS — E039 Hypothyroidism, unspecified: Secondary | ICD-10-CM | POA: Diagnosis not present

## 2023-06-09 DIAGNOSIS — E039 Hypothyroidism, unspecified: Secondary | ICD-10-CM | POA: Diagnosis not present

## 2023-06-09 DIAGNOSIS — I10 Essential (primary) hypertension: Secondary | ICD-10-CM | POA: Diagnosis not present

## 2023-07-09 DIAGNOSIS — E039 Hypothyroidism, unspecified: Secondary | ICD-10-CM | POA: Diagnosis not present

## 2023-07-09 DIAGNOSIS — I10 Essential (primary) hypertension: Secondary | ICD-10-CM | POA: Diagnosis not present

## 2023-07-25 DIAGNOSIS — E46 Unspecified protein-calorie malnutrition: Secondary | ICD-10-CM | POA: Diagnosis not present

## 2023-07-25 DIAGNOSIS — D6869 Other thrombophilia: Secondary | ICD-10-CM | POA: Diagnosis not present

## 2023-07-25 DIAGNOSIS — E039 Hypothyroidism, unspecified: Secondary | ICD-10-CM | POA: Diagnosis not present

## 2023-07-25 DIAGNOSIS — E559 Vitamin D deficiency, unspecified: Secondary | ICD-10-CM | POA: Diagnosis not present

## 2023-07-25 DIAGNOSIS — I10 Essential (primary) hypertension: Secondary | ICD-10-CM | POA: Diagnosis not present

## 2023-07-25 DIAGNOSIS — I4819 Other persistent atrial fibrillation: Secondary | ICD-10-CM | POA: Diagnosis not present

## 2023-07-25 DIAGNOSIS — Z6841 Body Mass Index (BMI) 40.0 and over, adult: Secondary | ICD-10-CM | POA: Diagnosis not present

## 2023-07-25 DIAGNOSIS — I4891 Unspecified atrial fibrillation: Secondary | ICD-10-CM | POA: Diagnosis not present

## 2023-07-25 DIAGNOSIS — E782 Mixed hyperlipidemia: Secondary | ICD-10-CM | POA: Diagnosis not present

## 2023-07-27 DIAGNOSIS — E039 Hypothyroidism, unspecified: Secondary | ICD-10-CM | POA: Diagnosis not present

## 2023-07-27 DIAGNOSIS — I10 Essential (primary) hypertension: Secondary | ICD-10-CM | POA: Diagnosis not present

## 2023-07-27 DIAGNOSIS — I4819 Other persistent atrial fibrillation: Secondary | ICD-10-CM | POA: Diagnosis not present

## 2023-07-27 DIAGNOSIS — E782 Mixed hyperlipidemia: Secondary | ICD-10-CM | POA: Diagnosis not present

## 2023-08-09 DIAGNOSIS — I10 Essential (primary) hypertension: Secondary | ICD-10-CM | POA: Diagnosis not present

## 2023-08-09 DIAGNOSIS — E039 Hypothyroidism, unspecified: Secondary | ICD-10-CM | POA: Diagnosis not present

## 2023-09-07 DIAGNOSIS — E559 Vitamin D deficiency, unspecified: Secondary | ICD-10-CM | POA: Diagnosis not present

## 2023-09-07 DIAGNOSIS — E039 Hypothyroidism, unspecified: Secondary | ICD-10-CM | POA: Diagnosis not present

## 2023-09-07 DIAGNOSIS — I4819 Other persistent atrial fibrillation: Secondary | ICD-10-CM | POA: Diagnosis not present

## 2023-09-09 DIAGNOSIS — E039 Hypothyroidism, unspecified: Secondary | ICD-10-CM | POA: Diagnosis not present

## 2023-09-09 DIAGNOSIS — I10 Essential (primary) hypertension: Secondary | ICD-10-CM | POA: Diagnosis not present

## 2023-09-14 DIAGNOSIS — Z6841 Body Mass Index (BMI) 40.0 and over, adult: Secondary | ICD-10-CM | POA: Diagnosis not present

## 2023-09-14 DIAGNOSIS — E782 Mixed hyperlipidemia: Secondary | ICD-10-CM | POA: Diagnosis not present

## 2023-09-14 DIAGNOSIS — I10 Essential (primary) hypertension: Secondary | ICD-10-CM | POA: Diagnosis not present

## 2023-09-14 DIAGNOSIS — Z7189 Other specified counseling: Secondary | ICD-10-CM | POA: Diagnosis not present

## 2023-09-14 DIAGNOSIS — I4819 Other persistent atrial fibrillation: Secondary | ICD-10-CM | POA: Diagnosis not present

## 2023-09-14 DIAGNOSIS — E46 Unspecified protein-calorie malnutrition: Secondary | ICD-10-CM | POA: Diagnosis not present

## 2023-09-14 DIAGNOSIS — E559 Vitamin D deficiency, unspecified: Secondary | ICD-10-CM | POA: Diagnosis not present

## 2023-09-14 DIAGNOSIS — I4891 Unspecified atrial fibrillation: Secondary | ICD-10-CM | POA: Diagnosis not present

## 2023-09-14 DIAGNOSIS — D6869 Other thrombophilia: Secondary | ICD-10-CM | POA: Diagnosis not present

## 2023-09-14 DIAGNOSIS — Z139 Encounter for screening, unspecified: Secondary | ICD-10-CM | POA: Diagnosis not present

## 2023-09-14 DIAGNOSIS — E039 Hypothyroidism, unspecified: Secondary | ICD-10-CM | POA: Diagnosis not present

## 2023-09-29 DIAGNOSIS — I4819 Other persistent atrial fibrillation: Secondary | ICD-10-CM | POA: Diagnosis not present

## 2023-09-29 DIAGNOSIS — E559 Vitamin D deficiency, unspecified: Secondary | ICD-10-CM | POA: Diagnosis not present

## 2023-09-29 DIAGNOSIS — E039 Hypothyroidism, unspecified: Secondary | ICD-10-CM | POA: Diagnosis not present

## 2023-10-09 DIAGNOSIS — E039 Hypothyroidism, unspecified: Secondary | ICD-10-CM | POA: Diagnosis not present

## 2023-10-16 DIAGNOSIS — Z87891 Personal history of nicotine dependence: Secondary | ICD-10-CM | POA: Diagnosis not present

## 2023-10-16 DIAGNOSIS — E039 Hypothyroidism, unspecified: Secondary | ICD-10-CM | POA: Diagnosis not present

## 2023-10-16 DIAGNOSIS — I10 Essential (primary) hypertension: Secondary | ICD-10-CM | POA: Diagnosis not present

## 2023-10-16 DIAGNOSIS — E782 Mixed hyperlipidemia: Secondary | ICD-10-CM | POA: Diagnosis not present

## 2023-10-16 DIAGNOSIS — E559 Vitamin D deficiency, unspecified: Secondary | ICD-10-CM | POA: Diagnosis not present

## 2023-10-16 DIAGNOSIS — E538 Deficiency of other specified B group vitamins: Secondary | ICD-10-CM | POA: Diagnosis not present

## 2023-10-22 DIAGNOSIS — Z87891 Personal history of nicotine dependence: Secondary | ICD-10-CM | POA: Diagnosis not present

## 2023-10-22 DIAGNOSIS — E538 Deficiency of other specified B group vitamins: Secondary | ICD-10-CM | POA: Diagnosis not present

## 2023-10-22 DIAGNOSIS — I10 Essential (primary) hypertension: Secondary | ICD-10-CM | POA: Diagnosis not present

## 2023-10-22 DIAGNOSIS — E039 Hypothyroidism, unspecified: Secondary | ICD-10-CM | POA: Diagnosis not present

## 2023-10-22 DIAGNOSIS — E782 Mixed hyperlipidemia: Secondary | ICD-10-CM | POA: Diagnosis not present

## 2023-10-22 DIAGNOSIS — E559 Vitamin D deficiency, unspecified: Secondary | ICD-10-CM | POA: Diagnosis not present

## 2023-11-09 DIAGNOSIS — E039 Hypothyroidism, unspecified: Secondary | ICD-10-CM | POA: Diagnosis not present

## 2023-12-09 DIAGNOSIS — E039 Hypothyroidism, unspecified: Secondary | ICD-10-CM | POA: Diagnosis not present

## 2023-12-09 DIAGNOSIS — I10 Essential (primary) hypertension: Secondary | ICD-10-CM | POA: Diagnosis not present

## 2023-12-10 DIAGNOSIS — E038 Other specified hypothyroidism: Secondary | ICD-10-CM | POA: Diagnosis not present

## 2023-12-10 DIAGNOSIS — I10 Essential (primary) hypertension: Secondary | ICD-10-CM | POA: Diagnosis not present

## 2023-12-10 DIAGNOSIS — Z7189 Other specified counseling: Secondary | ICD-10-CM | POA: Diagnosis not present

## 2023-12-10 DIAGNOSIS — E782 Mixed hyperlipidemia: Secondary | ICD-10-CM | POA: Diagnosis not present

## 2023-12-10 DIAGNOSIS — E559 Vitamin D deficiency, unspecified: Secondary | ICD-10-CM | POA: Diagnosis not present

## 2023-12-22 DIAGNOSIS — E782 Mixed hyperlipidemia: Secondary | ICD-10-CM | POA: Diagnosis not present

## 2023-12-22 DIAGNOSIS — E038 Other specified hypothyroidism: Secondary | ICD-10-CM | POA: Diagnosis not present

## 2023-12-22 DIAGNOSIS — E559 Vitamin D deficiency, unspecified: Secondary | ICD-10-CM | POA: Diagnosis not present

## 2023-12-22 DIAGNOSIS — I10 Essential (primary) hypertension: Secondary | ICD-10-CM | POA: Diagnosis not present
